# Patient Record
Sex: Female | Born: 1990
Health system: Southern US, Community
[De-identification: ages and names within clinical notes are randomized; demographics above are authoritative.]

## PROBLEM LIST (undated history)

## (undated) DIAGNOSIS — K219 Gastro-esophageal reflux disease without esophagitis: Secondary | ICD-10-CM

## (undated) DIAGNOSIS — G43909 Migraine, unspecified, not intractable, without status migrainosus: Secondary | ICD-10-CM

## (undated) DIAGNOSIS — F419 Anxiety disorder, unspecified: Secondary | ICD-10-CM

## (undated) HISTORY — DX: Migraine, unspecified, not intractable, without status migrainosus: G43.909

## (undated) HISTORY — DX: Gastro-esophageal reflux disease without esophagitis: K21.9

## (undated) HISTORY — DX: Anxiety disorder, unspecified: F41.9

## (undated) HISTORY — PX: EYE SURGERY: SHX253

---

## 2011-08-26 DIAGNOSIS — F419 Anxiety disorder, unspecified: Secondary | ICD-10-CM | POA: Insufficient documentation

## 2013-05-28 ENCOUNTER — Telehealth: Payer: Self-pay | Admitting: Gynecology

## 2013-05-28 ENCOUNTER — Encounter: Payer: Self-pay | Admitting: Gynecology

## 2013-05-28 ENCOUNTER — Ambulatory Visit (INDEPENDENT_AMBULATORY_CARE_PROVIDER_SITE_OTHER): Payer: BC Managed Care – PPO | Admitting: Gynecology

## 2013-05-28 VITALS — BP 110/68 | Ht 61.5 in | Wt 106.0 lb

## 2013-05-28 DIAGNOSIS — N920 Excessive and frequent menstruation with regular cycle: Secondary | ICD-10-CM

## 2013-05-28 DIAGNOSIS — Z30430 Encounter for insertion of intrauterine contraceptive device: Secondary | ICD-10-CM

## 2013-05-28 DIAGNOSIS — Z309 Encounter for contraceptive management, unspecified: Secondary | ICD-10-CM

## 2013-05-28 MED ORDER — MEGESTROL ACETATE 40 MG PO TABS: 40.0000 mg | ORAL_TABLET | Freq: Two times a day (BID) | ORAL | Status: DC

## 2013-05-28 NOTE — Patient Instructions (Addendum)
Intrauterine Device Information  An intrauterine device (IUD) is inserted into your uterus and prevents pregnancy. There are 2 types of IUDs available:  · Copper IUD. This type of IUD is wrapped in copper wire and is placed inside the uterus. Copper makes the uterus and fallopian tubes produce a fluid that kills sperm. The copper IUD can stay in place for 10 years.  · Hormone IUD. This type of IUD contains the hormone progestin (synthetic progesterone). The hormone thickens the cervical mucus and prevents sperm from entering the uterus, and it also thins the uterine lining to prevent implantation of a fertilized egg. The hormone can weaken or kill the sperm that get into the uterus. The hormone IUD can stay in place for 5 years.  Your caregiver will make sure you are a good candidate for a contraceptive IUD. Discuss with your caregiver the possible side effects.  ADVANTAGES  · It is highly effective, reversible, long-acting, and low maintenance.  · There are no estrogen-related side effects.  · An IUD can be used when breastfeeding.  · It is not associated with weight gain.  · It works immediately after insertion.  · The copper IUD does not interfere with your female hormones.  · The progesterone IUD can make heavy menstrual periods lighter.  · The progesterone IUD can be used for 5 years.  · The copper IUD can be used for 10 years.  DISADVANTAGES  · The progesterone IUD can be associated with irregular bleeding patterns.  · The copper IUD can make your menstrual flow heavier and more painful.  · You may experience cramping and vaginal bleeding after insertion.  Document Released: 07/13/2004 Document Revised: 11/01/2011 Document Reviewed: 12/12/2010  ExitCare® Patient Information ©2014 [employer].

## 2013-05-28 NOTE — Progress Notes (Signed)
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 She had gone to the urgent care and I had a negative pregnancy test 3 days ago. She took out the NuvaRing 2 days ago because of her persistent bleeding. This is cause her to have nausea. She stated they did a CBC which was normal as well. She stated that her early this year she had a normal gynecological exam with Pap smear.  We discussed alternative forms of contraception. She does states that when she is not using anything for contraception for her cycles are very irregular. We discussed the different types of contraceptive options and I feel that the Mirena IUD would serve 2 purposes mostly for contraception but as well as for cycle control.  She was counseled for the Mirena IUD as follows:                                 IUD procedure note        The patient had been provided with literature information on this method of contraception. The risks benefits and pros and cons were discussed and all her questions were answered. She is fully aware that this form of contraception is 99% effective and is good for 5 years.  Pelvic exam: Bartholin urethra Skene glands: Within normal limits Vagina: No lesions or discharge,menstrual blood presence Cervix: No lesions or discharge Uterus: retroverted position Adnexa: No masses or tenderness Rectal exam: Not done  The cervix was cleansed with Betadine solution. A single-tooth tenaculum was placed on the anterior cervical lip. The uterus sounded to 7 centimeter. The IUD was shown to the patient and inserted in a sterile fashion. The IUD string was trimmed. The single-tooth tenaculum was removed. Patient was instructed to return back to the office in one month for follow up.      She will be prescribed Megace 40 mg twice a day for 5 days as well to stop the  bleeding.

## 2013-05-28 NOTE — Telephone Encounter (Signed)
05/28/13-Pt was advised that her ins covers the Mirena and insertion at 100%,preventative svcs. JF inserted it today/[facility]

## 2013-05-29 ENCOUNTER — Encounter: Payer: Self-pay | Admitting: Gynecology

## 2013-05-29 ENCOUNTER — Telehealth: Payer: Self-pay | Admitting: Obstetrics & Gynecology

## 2013-05-29 MED ORDER — TRAMADOL HCL 50 MG PO TABS: ORAL_TABLET | ORAL | Status: DC

## 2013-05-29 NOTE — Telephone Encounter (Signed)
Pt had Mirena IUD placed Mon, Oct 6th.  Has been taking Motrin, 400mg , twice today without much improvement in cramping.  She is anxious about this and wants to know if normal and what she can do for pain.  Can feel the iud string.  Feels the same to her as yesterday.  No nausea.  No fever.  Advised to take 800mg  ibuprofen every 6-8 hours around the clock.  Called in Tramadol 50 mg 1-2 every six hours for pain.  Advised pt to call office for follow up if till cramping the same tomorrow.  Pt appreciative and voiced clear understanding.

## 2013-06-19 ENCOUNTER — Ambulatory Visit (INDEPENDENT_AMBULATORY_CARE_PROVIDER_SITE_OTHER): Payer: BC Managed Care – PPO | Admitting: Women's Health

## 2013-06-19 ENCOUNTER — Encounter: Payer: Self-pay | Admitting: Women's Health

## 2013-06-19 DIAGNOSIS — R109 Unspecified abdominal pain: Secondary | ICD-10-CM

## 2013-06-19 DIAGNOSIS — Z975 Presence of (intrauterine) contraceptive device: Secondary | ICD-10-CM | POA: Insufficient documentation

## 2013-06-19 LAB — WET PREP FOR TRICH, YEAST, CLUE: Yeast Wet Prep HPF POC: NONE SEEN

## 2013-06-19 NOTE — Progress Notes (Signed)
Patient ID: Kayla Hampton, female   DOB: 04/13/1991, 22 y.o.   MRN: [id number] Presents with complaint of pelvic/menstrual cramping for past 3 weeks. Minimal relief with ultram and Motrin. 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 Mirena IUD placed 05/28/2013 by Dr. Lily Peer and has had cramping since with minimal spotting. Denies fever, GI symptoms or urinary symptoms. Same partner/years. Gardasil series completed.  Exam: Appears well. External genitalia within normal limits, speculum exam IUD strings visible, scant brown discharge with no odor or erythema noted. Wet prep negative. Bimanual no CMT or adnexal fullness or tenderness, minimal tenderness with exam.  Cramping with new IUD  Plan: Reviewed normality of wet prep, ultrasound to check proper placement. Continue Motrin as needed for discomfort. Note written for missed clinical experience. Reviewed may also be time of menstrual cycle.

## 2013-06-22 ENCOUNTER — Ambulatory Visit: Payer: BC Managed Care – PPO | Admitting: Women's Health

## 2013-06-22 ENCOUNTER — Other Ambulatory Visit: Payer: BC Managed Care – PPO

## 2013-06-27 ENCOUNTER — Ambulatory Visit (INDEPENDENT_AMBULATORY_CARE_PROVIDER_SITE_OTHER): Payer: BC Managed Care – PPO | Admitting: Women's Health

## 2013-06-27 ENCOUNTER — Ambulatory Visit (INDEPENDENT_AMBULATORY_CARE_PROVIDER_SITE_OTHER): Payer: BC Managed Care – PPO

## 2013-06-27 ENCOUNTER — Encounter: Payer: Self-pay | Admitting: Women's Health

## 2013-06-27 ENCOUNTER — Other Ambulatory Visit: Payer: Self-pay | Admitting: Women's Health

## 2013-06-27 DIAGNOSIS — N8300 Follicular cyst of ovary, unspecified side: Secondary | ICD-10-CM

## 2013-06-27 DIAGNOSIS — N854 Malposition of uterus: Secondary | ICD-10-CM

## 2013-06-27 DIAGNOSIS — R109 Unspecified abdominal pain: Secondary | ICD-10-CM

## 2013-06-27 DIAGNOSIS — R102 Pelvic and perineal pain: Secondary | ICD-10-CM

## 2013-06-27 DIAGNOSIS — N949 Unspecified condition associated with female genital organs and menstrual cycle: Secondary | ICD-10-CM

## 2013-06-27 NOTE — Progress Notes (Signed)
Patient ID: Kayla Hampton, female   DOB: 1990/11/26, 22 y.o.   MRN: [id number] Presents for ultrasound. Mirena IUD placed 05/28/2013 and has had cramping severe at times since. States bleeding/spotting has stopped. Cramping is less.  Ultrasound: Retroverted uterus IUD seen in normal position. Tri layered endometrial cavity. Right and left ovaries normal. Negative cul-de-sac.  Mirena IUD with cramping  Plan: Reviewed normality of ultrasound, Motrin as needed for discomfort, reviewed cramping should continue to decrease. Instructed to call if further problems.

## 2013-06-28 ENCOUNTER — Ambulatory Visit: Payer: BC Managed Care – PPO | Admitting: Gynecology

## 2015-02-04 DIAGNOSIS — B001 Herpesviral vesicular dermatitis: Secondary | ICD-10-CM | POA: Insufficient documentation

## 2016-04-13 DIAGNOSIS — F411 Generalized anxiety disorder: Secondary | ICD-10-CM | POA: Insufficient documentation

## 2016-06-25 DIAGNOSIS — G4726 Circadian rhythm sleep disorder, shift work type: Secondary | ICD-10-CM | POA: Insufficient documentation

## 2017-08-19 DIAGNOSIS — G43009 Migraine without aura, not intractable, without status migrainosus: Secondary | ICD-10-CM | POA: Insufficient documentation

## 2020-09-25 DIAGNOSIS — K219 Gastro-esophageal reflux disease without esophagitis: Secondary | ICD-10-CM | POA: Insufficient documentation

## 2021-05-28 ENCOUNTER — Other Ambulatory Visit (HOSPITAL_COMMUNITY): Payer: Self-pay

## 2021-05-28 MED ORDER — VORTIOXETINE HBR 10 MG PO TABS
10.0000 mg | ORAL_TABLET | Freq: Every day | ORAL | 2 refills | Status: DC
  Filled 2021-05-28: qty 90, 90d supply, fill #0
  Filled 2021-08-24: qty 90, 90d supply, fill #1

## 2021-05-28 MED ORDER — DEXLANSOPRAZOLE 60 MG PO CPDR
1.0000 | DELAYED_RELEASE_CAPSULE | Freq: Every day | ORAL | 3 refills | Status: DC
  Filled 2021-05-28 – 2021-06-05 (×2): qty 90, 90d supply, fill #0

## 2021-05-28 MED ORDER — PANTOPRAZOLE SODIUM 40 MG PO TBEC
40.0000 mg | DELAYED_RELEASE_TABLET | Freq: Every day | ORAL | 3 refills | Status: DC
  Filled 2021-05-28 (×3): qty 90, 90d supply, fill #0
  Filled 2021-08-24: qty 90, 90d supply, fill #1
  Filled 2021-12-01: qty 90, 90d supply, fill #2
  Filled 2022-03-15: qty 90, 90d supply, fill #3

## 2021-05-29 ENCOUNTER — Other Ambulatory Visit (HOSPITAL_COMMUNITY): Payer: Self-pay

## 2021-06-05 ENCOUNTER — Other Ambulatory Visit (HOSPITAL_COMMUNITY): Payer: Self-pay

## 2021-08-19 ENCOUNTER — Other Ambulatory Visit (HOSPITAL_COMMUNITY): Payer: Self-pay

## 2021-08-19 ENCOUNTER — Telehealth: Payer: 59 | Admitting: Family

## 2021-08-19 DIAGNOSIS — J019 Acute sinusitis, unspecified: Secondary | ICD-10-CM

## 2021-08-19 MED ORDER — ONDANSETRON HCL 4 MG PO TABS
4.0000 mg | ORAL_TABLET | Freq: Three times a day (TID) | ORAL | 0 refills | Status: DC | PRN
  Filled 2021-08-19: qty 20, 7d supply, fill #0

## 2021-08-19 MED ORDER — AMOXICILLIN-POT CLAVULANATE 875-125 MG PO TABS
1.0000 | ORAL_TABLET | Freq: Two times a day (BID) | ORAL | 0 refills | Status: DC
  Filled 2021-08-19: qty 14, 7d supply, fill #0

## 2021-08-19 NOTE — Progress Notes (Signed)
Virtual Visit Consent   Artavia Hampton, you are scheduled for a virtual visit with a [facility] provider today.     Just as with appointments in the office, your consent must be obtained to participate.  Your consent will be active for this visit and any virtual visit you may have with one of our providers in the next 365 days.     If you have a MyChart account, a copy of this consent can be sent to you electronically.  All virtual visits are billed to your insurance company just like a traditional visit in the office.    As this is a virtual visit, video technology does not allow for your provider to perform a traditional examination.  This may limit your provider's ability to fully assess your condition.  If your provider identifies any concerns that need to be evaluated in person or the need to arrange testing (such as labs, EKG, etc.), we will make arrangements to do so.     Although advances in technology are sophisticated, we cannot ensure that it will always work on either your end or our end.  If the connection with a video visit is poor, the visit may have to be switched to a telephone visit.  With either a video or telephone visit, we are not always able to ensure that we have a secure connection.     I need to obtain your verbal consent now.   Are you willing to proceed with your visit today?    Aylah Yeary has provided verbal consent on 08/19/2021 for a virtual visit (video or telephone).   Jannifer Rodney, FNP   Date: 08/19/2021 8:18 AM   Virtual Visit via Video Note   I, Jannifer Rodney, connected with  Kayla Hampton  ([id number], 05-26-1991) on 08/19/21 at  8:15 AM EST by a video-enabled telemedicine application and verified that I am speaking with the correct person using two identifiers.  Location: Patient: Virtual Visit Location Patient: Other: work Provider: Pharmacist, community: Home Office   I discussed the limitations of evaluation and management by  telemedicine and the availability of in person appointments. The patient expressed understanding and agreed to proceed.    History of Present Illness: Kayla Hampton is a 30 y.o. who identifies as a female who was assigned female at birth, and is being seen today for sinus issues.  HPI: Sinus Problem This is a new problem. The current episode started 1 to 4 weeks ago. The problem has been gradually worsening since onset. There has been no fever. Her pain is at a severity of 2/10. The pain is moderate. Associated symptoms include congestion, coughing, ear pain, headaches, sinus pressure and sneezing. Pertinent negatives include no chills, shortness of breath or sore throat. (Dizziness ) Past treatments include oral decongestants. The treatment provided mild relief.   Problems:  Patient Active Problem List   Diagnosis Date Noted   IUD (intrauterine device) in place 06/19/2013   Menorrhagia 05/28/2013    Allergies: No Known Allergies Medications:  Current Outpatient Medications:    amoxicillin-clavulanate (AUGMENTIN) 875-125 MG tablet, Take 1 tablet by mouth 2 (two) times daily., Disp: 14 tablet, Rfl: 0   ondansetron (ZOFRAN) 4 MG tablet, Take 1 tablet (4 mg total) by mouth every 8 (eight) hours as needed for nausea or vomiting., Disp: 20 tablet, Rfl: 0   pantoprazole (PROTONIX) 40 MG tablet, Take 1 tablet (40 mg total) by mouth daily 30 minutes before meals, Disp: 90 tablet, Rfl:  3   vortioxetine HBr (TRINTELLIX) 10 MG TABS tablet, Take 1 tablet (10 mg total) by mouth daily., Disp: 90 tablet, Rfl: 2  Observations/Objective: Patient is well-developed, well-nourished in no acute distress.  Head is normocephalic, atraumatic.  No labored breathing.  Speech is clear and coherent with logical content.  Patient is alert and oriented at baseline.  Nasal congestion  Assessment and Plan: 1. Acute sinusitis, recurrence not specified, unspecified location - amoxicillin-clavulanate (AUGMENTIN)  875-125 MG tablet; Take 1 tablet by mouth 2 (two) times daily.  Dispense: 14 tablet; Refill: 0  - Take meds as prescribed - Use a cool mist humidifier  -Use saline nose sprays frequently -Force fluids -For any cough or congestion  Use plain Mucinex- regular strength or max strength is fine -For fever or aces or pains- take tylenol or ibuprofen. -Throat lozenges if help  Follow Up Instructions: I discussed the assessment and treatment plan with the patient. The patient was provided an opportunity to ask questions and all were answered. The patient agreed with the plan and demonstrated an understanding of the instructions.  A copy of instructions were sent to the patient via MyChart unless otherwise noted below.     The patient was advised to call back or seek an in-person evaluation if the symptoms worsen or if the condition fails to improve as anticipated.  Time:  I spent 6 minutes with the patient via telehealth technology discussing the above problems/concerns.    Jannifer Rodney, FNP

## 2021-08-24 ENCOUNTER — Other Ambulatory Visit (HOSPITAL_COMMUNITY): Payer: Self-pay

## 2021-09-14 ENCOUNTER — Other Ambulatory Visit (HOSPITAL_COMMUNITY): Payer: Self-pay

## 2021-09-14 DIAGNOSIS — F330 Major depressive disorder, recurrent, mild: Secondary | ICD-10-CM | POA: Diagnosis not present

## 2021-09-14 DIAGNOSIS — F411 Generalized anxiety disorder: Secondary | ICD-10-CM | POA: Diagnosis not present

## 2021-09-14 DIAGNOSIS — F900 Attention-deficit hyperactivity disorder, predominantly inattentive type: Secondary | ICD-10-CM | POA: Diagnosis not present

## 2021-09-14 DIAGNOSIS — F988 Other specified behavioral and emotional disorders with onset usually occurring in childhood and adolescence: Secondary | ICD-10-CM | POA: Insufficient documentation

## 2021-09-14 MED ORDER — ATOMOXETINE HCL 25 MG PO CAPS
25.0000 mg | ORAL_CAPSULE | Freq: Every day | ORAL | 0 refills | Status: DC
  Filled 2021-09-14: qty 14, 14d supply, fill #0

## 2021-09-15 ENCOUNTER — Other Ambulatory Visit (HOSPITAL_COMMUNITY): Payer: Self-pay

## 2021-09-25 ENCOUNTER — Other Ambulatory Visit (HOSPITAL_COMMUNITY): Payer: Self-pay

## 2021-09-25 ENCOUNTER — Encounter: Payer: Self-pay | Admitting: Medical-Surgical

## 2021-09-25 ENCOUNTER — Ambulatory Visit: Payer: 59 | Admitting: Medical-Surgical

## 2021-09-25 ENCOUNTER — Other Ambulatory Visit: Payer: Self-pay

## 2021-09-25 VITALS — BP 107/72 | HR 71 | Resp 20 | Ht 61.0 in | Wt 130.0 lb

## 2021-09-25 DIAGNOSIS — F32A Depression, unspecified: Secondary | ICD-10-CM | POA: Diagnosis not present

## 2021-09-25 DIAGNOSIS — K219 Gastro-esophageal reflux disease without esophagitis: Secondary | ICD-10-CM

## 2021-09-25 DIAGNOSIS — Z7689 Persons encountering health services in other specified circumstances: Secondary | ICD-10-CM | POA: Diagnosis not present

## 2021-09-25 DIAGNOSIS — R41840 Attention and concentration deficit: Secondary | ICD-10-CM | POA: Diagnosis not present

## 2021-09-25 DIAGNOSIS — F419 Anxiety disorder, unspecified: Secondary | ICD-10-CM

## 2021-09-25 DIAGNOSIS — Z0000 Encounter for general adult medical examination without abnormal findings: Secondary | ICD-10-CM

## 2021-09-25 DIAGNOSIS — Z01419 Encounter for gynecological examination (general) (routine) without abnormal findings: Secondary | ICD-10-CM

## 2021-09-25 MED ORDER — VORTIOXETINE HBR 10 MG PO TABS
15.0000 mg | ORAL_TABLET | Freq: Every day | ORAL | 0 refills | Status: DC
  Filled 2021-09-25 – 2021-09-29 (×4): qty 135, 90d supply, fill #0

## 2021-09-25 MED ORDER — ONDANSETRON HCL 4 MG PO TABS
4.0000 mg | ORAL_TABLET | Freq: Three times a day (TID) | ORAL | 0 refills | Status: DC | PRN
  Filled 2021-09-25: qty 20, 7d supply, fill #0

## 2021-09-25 NOTE — Progress Notes (Addendum)
New Patient Office Visit  Subjective:  Patient ID: Kayla Hampton, female    DOB: Nov 20, 1990  Age: 31 y.o. MRN: [id number]  CC:  Chief Complaint  Patient presents with   Establish Care    HPI [name] presents to establish care. Pleasant 31 y.o. female with no complaints at this time. Has been on Vortioxetine for the past year. Increased to 15mg  once daily.Recently started seeing a psychiatrist. States she feels her anxiety has gotten better and denies medication side effects. Also takes Strattera 25mg  for inattention, planning to do ADD/ADHD testing on the 6th with psychiatrist. Denies medication side effects, reports not seeing a huge difference since starting.  Has been seen by gastroenterology for reflux. Reports having reflux in the AM as well as some nausea and vomiting before going to work. She is on Protonix  40mg  daily in the morning and states her reflux is getting better. She denies vomiting episodes and reports the nausea before work has gotten better and is not occurring everyday.Denies chest pain, SOB, abdominal pain, or palpitations.   Would like to be referred to OBGYN to establish care. No concerns at this time.   Past Medical History:  Diagnosis Date   Anxiety    GERD (gastroesophageal reflux disease)    Migraines     Past Surgical History:  Procedure Laterality Date   EYE SURGERY  2021   Lasik    Family History  Problem Relation Age of Onset   Breast cancer Maternal Grandmother    Cancer Maternal Grandmother    Depression Maternal Grandmother    Diabetes Paternal Grandfather    Hypertension Paternal Grandfather    Cancer Paternal Grandfather    Anxiety disorder Mother    Depression Mother    Alcohol abuse Father    Drug abuse Father    ADD / ADHD Sister    Anxiety disorder Sister    Depression Sister    Anxiety disorder Sister    Depression Sister    Cancer Maternal Uncle     Social History   Socioeconomic History   Marital status:  Single    Spouse name: Not on file   Number of children: Not on file   Years of education: Not on file   Highest education level: Not on file  Occupational History   Not on file  Tobacco Use   Smoking status: Former    Packs/day: 0.25    Years: 1.00    Pack years: 0.25    Types: Cigarettes    Quit date: 09/12/2014    Years since quitting: 7.0   Smokeless tobacco: Never  Substance and Sexual Activity   Alcohol use: Yes    Comment: 2-3 drinks a year   Drug use: Never   Sexual activity: Yes    Birth control/protection: I.U.D.    Comment: nuvaring  Other Topics Concern   Not on file  Social History Narrative   Not on file   Social Determinants of Health   Financial Resource Strain: Not on file  Food Insecurity: Not on file  Transportation Needs: Not on file  Physical Activity: Not on file  Stress: Not on file  Social Connections: Not on file  Intimate Partner Violence: Not on file     Objective:   Today's Vitals: BP 107/72 (BP Location: Left Arm, Patient Position: Sitting, Cuff Size: Normal)    Pulse 71    Resp 20    Ht 5\' 1"  (1.549 m)    Wt 59  kg    SpO2 98%    BMI 24.56 kg/m   Physical Exam Constitutional:      Appearance: Normal appearance.  HENT:     Head: Normocephalic and atraumatic.  Cardiovascular:     Rate and Rhythm: Normal rate and regular rhythm.     Heart sounds: Normal heart sounds.  Pulmonary:     Effort: Pulmonary effort is normal.     Breath sounds: Normal breath sounds.  Skin:    General: Skin is warm and dry.  Neurological:     Mental Status: She is alert and oriented to person, place, and time.  Psychiatric:        Mood and Affect: Mood normal.        Behavior: Behavior normal.        Thought Content: Thought content normal.    Assessment & Plan:  1. Encounter to establish care -No concerns at this time  2. Well woman exam Would like referal to OBGYN to establish women's care - Ambulatory referral to Obstetrics / Gynecology   3.  Inattention -No concerns on strattera at this time -Being seen by psychiatry. Appointment on the 6th for ADD/ADHD testing  5. Gastroesophageal reflux disease without esophagitis - Doing well on Protonix, no concerns -Discussed adding Pepcid 20mg  at night in combination with Protonix if AM heartburn worsens. Discussed possibility of weaning off of long-term protonix.  -Will send refill of Zofran PRN AM nausea 6. Anxiety and depression -Doing well on Vortioxetine, no side effects --Discussed counseling, pt will notify if would like referral -Seeing psychiatry in [place], going well. Appt on the 6th.   Outpatient Encounter Medications as of 09/25/2021  Medication Sig   atomoxetine (STRATTERA) 25 MG capsule Take 1 capsule (25 mg total) by mouth daily.   pantoprazole (PROTONIX) 40 MG tablet Take 1 tablet (40 mg total) by mouth daily 30 minutes before meals   [DISCONTINUED] ondansetron (ZOFRAN) 4 MG tablet Take 1 tablet (4 mg total) by mouth every 8 (eight) hours as needed for nausea or vomiting.   [DISCONTINUED] vortioxetine HBr (TRINTELLIX) 10 MG TABS tablet Take 1 tablet (10 mg total) by mouth daily.   ondansetron (ZOFRAN) 4 MG tablet Take 1 tablet (4 mg total) by mouth every 8 (eight) hours as needed for nausea or vomiting.   vortioxetine HBr (TRINTELLIX) 10 MG TABS tablet Take 1.5 tablets (15 mg total) by mouth daily.   [DISCONTINUED] amoxicillin-clavulanate (AUGMENTIN) 875-125 MG tablet Take 1 tablet by mouth 2 (two) times daily.   No facility-administered encounter medications on file as of 09/25/2021.    Follow-up: Return for annual physical exam at your convenience.   11/23/2021, Student NP

## 2021-09-25 NOTE — Progress Notes (Signed)
Medical screening examination/treatment was performed by a student nurse practitioner and as supervising provider I was immediately available for consultation/collaboration. I have reviewed documentation and agree with assessment and plan.  Thayer Ohm, DNP, APRN, FNP-BC [facility]

## 2021-09-28 DIAGNOSIS — F900 Attention-deficit hyperactivity disorder, predominantly inattentive type: Secondary | ICD-10-CM | POA: Diagnosis not present

## 2021-09-28 DIAGNOSIS — F330 Major depressive disorder, recurrent, mild: Secondary | ICD-10-CM | POA: Diagnosis not present

## 2021-09-28 DIAGNOSIS — F411 Generalized anxiety disorder: Secondary | ICD-10-CM | POA: Diagnosis not present

## 2021-09-29 ENCOUNTER — Other Ambulatory Visit (HOSPITAL_COMMUNITY): Payer: Self-pay

## 2021-09-29 ENCOUNTER — Encounter: Payer: Self-pay | Admitting: Medical-Surgical

## 2021-09-29 MED ORDER — ATOMOXETINE HCL 40 MG PO CAPS
40.0000 mg | ORAL_CAPSULE | Freq: Every day | ORAL | 0 refills | Status: DC
  Filled 2021-09-29: qty 30, 30d supply, fill #0

## 2021-09-29 MED ORDER — VORTIOXETINE HBR 20 MG PO TABS
20.0000 mg | ORAL_TABLET | Freq: Every day | ORAL | 1 refills | Status: DC
  Filled 2021-09-29: qty 90, 90d supply, fill #0
  Filled 2022-02-03: qty 90, 90d supply, fill #1

## 2021-10-05 ENCOUNTER — Other Ambulatory Visit (HOSPITAL_COMMUNITY): Payer: Self-pay

## 2021-10-06 ENCOUNTER — Telehealth: Payer: Self-pay

## 2021-10-06 NOTE — Telephone Encounter (Signed)
Submitted by another user Prior authorization BPZ:WCHENIDPOEUM HBr (TRINTELLIX) 20 MG TABS tab Via: Covermymeds Case/Key:[id number] Status: approved  as of 10/05/21 Reason:10/05/2021-10/04/2022 120 tab for 30days Notified Pt via: informed

## 2021-10-21 ENCOUNTER — Emergency Department (HOSPITAL_COMMUNITY)
Admission: EM | Admit: 2021-10-21 | Discharge: 2021-10-21 | Disposition: A | Discharge status: Home / Self Care | Payer: 59 | Attending: Emergency Medicine | Admitting: Emergency Medicine

## 2021-10-21 ENCOUNTER — Other Ambulatory Visit: Payer: Self-pay

## 2021-10-21 ENCOUNTER — Encounter (HOSPITAL_COMMUNITY): Payer: Self-pay | Admitting: Emergency Medicine

## 2021-10-21 ENCOUNTER — Other Ambulatory Visit (HOSPITAL_COMMUNITY): Payer: Self-pay

## 2021-10-21 DIAGNOSIS — R1110 Vomiting, unspecified: Secondary | ICD-10-CM | POA: Insufficient documentation

## 2021-10-21 DIAGNOSIS — E876 Hypokalemia: Secondary | ICD-10-CM | POA: Insufficient documentation

## 2021-10-21 DIAGNOSIS — E860 Dehydration: Secondary | ICD-10-CM | POA: Insufficient documentation

## 2021-10-21 DIAGNOSIS — R112 Nausea with vomiting, unspecified: Secondary | ICD-10-CM

## 2021-10-21 LAB — COMPREHENSIVE METABOLIC PANEL
ALT: 19 U/L (ref 0–44)
AST: 23 U/L (ref 15–41)
Albumin: 5.4 g/dL — ABNORMAL HIGH (ref 3.5–5.0)
Alkaline Phosphatase: 66 U/L (ref 38–126)
Anion gap: 7 (ref 5–15)
BUN: 10 mg/dL (ref 6–20)
CO2: 28 mmol/L (ref 22–32)
Calcium: 9.6 mg/dL (ref 8.9–10.3)
Chloride: 101 mmol/L (ref 98–111)
Creatinine, Ser: 0.71 mg/dL (ref 0.44–1.00)
GFR, Estimated: >60 mL/min (ref >60)
Glucose, Bld: 98 mg/dL (ref 70–99)
Potassium: 3.1 mmol/L — ABNORMAL LOW (ref 3.5–5.1)
Sodium: 136 mmol/L (ref 135–145)
Total Bilirubin: 1 mg/dL (ref 0.3–1.2)
Total Protein: 9.3 g/dL — ABNORMAL HIGH (ref 6.5–8.1)

## 2021-10-21 LAB — CBC WITH DIFFERENTIAL/PLATELET
Abs Immature Granulocytes: 0.01 10*3/uL (ref 0.00–0.07)
Basophils Absolute: 0 10*3/uL (ref 0.0–0.1)
Basophils Relative: 1 %
Eosinophils Absolute: 0 10*3/uL (ref 0.0–0.5)
Eosinophils Relative: 1 %
HCT: 45.8 % (ref 36.0–46.0)
Hemoglobin: 16.1 g/dL — ABNORMAL HIGH (ref 12.0–15.0)
Immature Granulocytes: 0 %
Lymphocytes Relative: 12 %
Lymphs Abs: 0.8 10*3/uL (ref 0.7–4.0)
MCH: 32.3 pg (ref 26.0–34.0)
MCHC: 35.2 g/dL (ref 30.0–36.0)
MCV: 91.8 fL (ref 80.0–100.0)
Monocytes Absolute: 0.4 10*3/uL (ref 0.1–1.0)
Monocytes Relative: 7 %
Neutro Abs: 5.2 10*3/uL (ref 1.7–7.7)
Neutrophils Relative %: 79 %
Platelets: 339 10*3/uL (ref 150–400)
RBC: 4.99 MIL/uL (ref 3.87–5.11)
RDW: 12 % (ref 11.5–15.5)
WBC: 6.6 10*3/uL (ref 4.0–10.5)
nRBC: 0 % (ref 0.0–0.2)

## 2021-10-21 LAB — I-STAT BETA HCG BLOOD, ED (MC, WL, AP ONLY): I-stat hCG, quantitative: <5 m[IU]/mL (ref <5)

## 2021-10-21 LAB — LIPASE, BLOOD: Lipase: 27 U/L (ref 11–51)

## 2021-10-21 MED ORDER — LACTATED RINGERS IV SOLN: INTRAVENOUS | Status: DC

## 2021-10-21 MED ORDER — LACTATED RINGERS IV BOLUS
1000.0000 mL | Freq: Once | INTRAVENOUS | Status: AC
  Administered 2021-10-21: 1000 mL via INTRAVENOUS

## 2021-10-21 MED ORDER — POTASSIUM CHLORIDE CRYS ER 20 MEQ PO TBCR
40.0000 meq | EXTENDED_RELEASE_TABLET | Freq: Once | ORAL | Status: AC
  Administered 2021-10-21: 40 meq via ORAL
  Filled 2021-10-21: qty 2

## 2021-10-21 MED ORDER — POTASSIUM CHLORIDE 10 MEQ/100ML IV SOLN: 10.0000 meq | Freq: Once | INTRAVENOUS | Status: DC

## 2021-10-21 MED ORDER — PANTOPRAZOLE SODIUM 40 MG IV SOLR
40.0000 mg | Freq: Once | INTRAVENOUS | Status: AC
  Administered 2021-10-21: 40 mg via INTRAVENOUS
  Filled 2021-10-21: qty 10

## 2021-10-21 MED ORDER — PANTOPRAZOLE SODIUM 20 MG PO TBEC
20.0000 mg | DELAYED_RELEASE_TABLET | Freq: Two times a day (BID) | ORAL | 0 refills | Status: DC
  Filled 2021-10-21: qty 30, 15d supply, fill #0

## 2021-10-21 MED ORDER — METOCLOPRAMIDE HCL 5 MG/ML IJ SOLN
10.0000 mg | Freq: Once | INTRAMUSCULAR | Status: AC
  Administered 2021-10-21: 10 mg via INTRAVENOUS
  Filled 2021-10-21: qty 2

## 2021-10-21 MED ORDER — ONDANSETRON 8 MG PO TBDP
8.0000 mg | ORAL_TABLET | Freq: Three times a day (TID) | ORAL | 0 refills | Status: DC | PRN
  Filled 2021-10-21: qty 20, 7d supply, fill #0

## 2021-10-21 MED ORDER — POTASSIUM CHLORIDE ER 10 MEQ PO TBCR
10.0000 meq | EXTENDED_RELEASE_TABLET | Freq: Every day | ORAL | 0 refills | Status: DC
  Filled 2021-10-21: qty 4, 4d supply, fill #0

## 2021-10-21 NOTE — ED Triage Notes (Signed)
Reports emesis since last week, thought it resolved but then it started again this Monday. Was around sick contacts with similar symptoms last week, but their symptoms have since resolved. Reports not being able to keep down any food or fluids since Monday.  ?

## 2021-10-21 NOTE — ED Provider Notes (Signed)
?[facility]-EMERGENCY DEPT ?Provider Note ? ? ?CSN: [id number] ?Arrival date & time: 10/21/21  0854 ? ?  ? ?History ? ?Chief Complaint  ?Patient presents with  ? Emesis  ? ? ?Kayla Hampton is a 31 y.o. female. ? ?31 year old female presents with several days of emesis.  Emesis initially was described as being bilious which is since improved.  No fever or chills.  No diarrhea.  No associate abdominal pain.  Does have a history of GI issues in the past and has been seeing by a gastroenterologist and had a EGD without etiology of this.  States she feels dehydrated.  And keep small sips of water now.  Denies any urinary dysuria but does appreciate some decreased urination.  No vaginal bleeding or discharge.  Has an IUD and is unsure of her last menstrual period ? ? ?  ? ?Home Medications ?Prior to Admission medications   ?Medication Sig Start Date End Date Taking? Authorizing Provider  ?atomoxetine (STRATTERA) 25 MG capsule Take 1 capsule (25 mg total) by mouth daily. 09/14/21     ?atomoxetine (STRATTERA) 40 MG capsule Take 1 capsule (40 mg total) by mouth daily. 09/28/21     ?ondansetron (ZOFRAN) 4 MG tablet Take 1 tablet (4 mg total) by mouth every 8 (eight) hours as needed for nausea or vomiting. 09/25/21   Christen Butter, NP  ?pantoprazole (PROTONIX) 40 MG tablet Take 1 tablet (40 mg total) by mouth daily 30 minutes before meals 05/28/21     ?vortioxetine HBr (TRINTELLIX) 20 MG TABS tablet Take 1 tablet (20 mg total) by mouth daily. 09/29/21   Christen Butter, NP  ?   ? ?Allergies    ?Patient has no known allergies.   ? ?Review of Systems   ?Review of Systems  ?All other systems reviewed and are negative. ? ?Physical Exam ?Updated Vital Signs ?BP (!) 142/81 (BP Location: Right Arm)   Pulse 83   Temp (!) 97.5 ?F (36.4 ?C) (Oral)   Resp 16   Ht 1.549 m (5\' 1" )   Wt 59 kg   SpO2 99%   BMI 24.58 kg/m?  ?Physical Exam ?Vitals and nursing note reviewed.  ?Constitutional:   ?   General: She is not in acute  distress. ?   Appearance: Normal appearance. She is well-developed. She is not toxic-appearing.  ?HENT:  ?   Head: Normocephalic and atraumatic.  ?Eyes:  ?   General: Lids are normal.  ?   Conjunctiva/sclera: Conjunctivae normal.  ?   Pupils: Pupils are equal, round, and reactive to light.  ?Neck:  ?   Thyroid: No thyroid mass.  ?   Trachea: No tracheal deviation.  ?Cardiovascular:  ?   Rate and Rhythm: Normal rate and regular rhythm.  ?   Heart sounds: Normal heart sounds. No murmur heard. ?  No gallop.  ?Pulmonary:  ?   Effort: Pulmonary effort is normal. No respiratory distress.  ?   Breath sounds: Normal breath sounds. No stridor. No decreased breath sounds, wheezing, rhonchi or rales.  ?Abdominal:  ?   General: There is no distension.  ?   Palpations: Abdomen is soft.  ?   Tenderness: There is no abdominal tenderness. There is no rebound.  ?Musculoskeletal:     ?   General: No tenderness. Normal range of motion.  ?   Cervical back: Normal range of motion and neck supple.  ?Skin: ?   General: Skin is warm and dry.  ?   Findings:  No abrasion or rash.  ?Neurological:  ?   Mental Status: She is alert and oriented to person, place, and time. Mental status is at baseline.  ?   GCS: GCS eye subscore is 4. GCS verbal subscore is 5. GCS motor subscore is 6.  ?   Cranial Nerves: No cranial nerve deficit.  ?   Sensory: No sensory deficit.  ?   Motor: Motor function is intact.  ?Psychiatric:     ?   Attention and Perception: Attention normal.     ?   Speech: Speech normal.     ?   Behavior: Behavior normal.  ? ? ?ED Results / Procedures / Treatments   ?Labs ?(all labs ordered are listed, but only abnormal results are displayed) ?Labs Reviewed  ?CBC WITH DIFFERENTIAL/PLATELET  ?COMPREHENSIVE METABOLIC PANEL  ?LIPASE, BLOOD  ?I-STAT BETA HCG BLOOD, ED ([facility], [facility], [facility] ONLY)  ? ? ?EKG ?None ? ?Radiology ?No results found. ? ?Procedures ?Procedures  ? ? ?Medications Ordered in ED ?Medications  ?lactated ringers bolus 1,000 mL (has  no administration in time range)  ?lactated ringers infusion (has no administration in time range)  ?pantoprazole (PROTONIX) injection 40 mg (has no administration in time range)  ?metoCLOPramide (REGLAN) injection 10 mg (has no administration in time range)  ? ? ?ED Course/ Medical Decision Making/ A&P ?  ?                        ?Medical Decision Making ?Amount and/or Complexity of Data Reviewed ?Labs: ordered. ? ?Risk ?Prescription drug management. ? ? ?Patient given IV fluids here for suspected dehydration.  Also given antiemetics as well to.  Concern for possible GI etiology of this such as peptic ulcer disease.  Given Protonix IV piggyback and feels much better.  Does have low potassium with K of 3.1.  Patient given oral potassium supplementation at this time.  Patient also given IV Reglan and notes that she now has an appetite.  Plan will be to place patient on several days of potassium supplementation as well as placed on Protonix and patient to follow-up with her GI doctor ? ? ? ? ? ? ? ?Final Clinical Impression(s) / ED Diagnoses ?Final diagnoses:  ?None  ? ? ?Rx / DC Orders ?ED Discharge Orders   ? ? None  ? ?  ? ? ?  ?Lorre Nick, MD ?10/21/21 1044 ? ?

## 2021-10-22 ENCOUNTER — Encounter: Payer: Self-pay | Admitting: Medical-Surgical

## 2021-10-22 DIAGNOSIS — R112 Nausea with vomiting, unspecified: Secondary | ICD-10-CM

## 2021-10-26 DIAGNOSIS — F411 Generalized anxiety disorder: Secondary | ICD-10-CM | POA: Diagnosis not present

## 2021-10-26 DIAGNOSIS — F330 Major depressive disorder, recurrent, mild: Secondary | ICD-10-CM | POA: Diagnosis not present

## 2021-10-26 DIAGNOSIS — F900 Attention-deficit hyperactivity disorder, predominantly inattentive type: Secondary | ICD-10-CM | POA: Diagnosis not present

## 2021-10-29 ENCOUNTER — Other Ambulatory Visit (HOSPITAL_COMMUNITY): Payer: Self-pay

## 2021-10-29 MED ORDER — ATOMOXETINE HCL 40 MG PO CAPS
40.0000 mg | ORAL_CAPSULE | Freq: Every day | ORAL | 1 refills | Status: DC
  Filled 2021-10-29: qty 30, 30d supply, fill #0
  Filled 2021-11-29: qty 30, 30d supply, fill #1

## 2021-11-02 ENCOUNTER — Encounter: Payer: Self-pay | Admitting: Medical-Surgical

## 2021-11-02 ENCOUNTER — Telehealth: Payer: Self-pay | Admitting: Gastroenterology

## 2021-11-02 NOTE — Telephone Encounter (Signed)
Good Morning Dr. Loletha Carrow, ? ?(D.O.D 3/2 PM) ? ? ?I have records for review for patient in Epic from [facility]. Patient is wanting to transfer care to [place] because she feels that [facility] did not help her.Patient has a referral in for Nausea and vomiting. Will you please review records and advise on scheduling? ? ?Thank you.  ?

## 2021-11-08 NOTE — Telephone Encounter (Signed)
Request received to transfer GI care from outside practice to [place].  We appreciate the interest in our practice, however at this time due to high demand from patients without established GI providers we cannot accommodate this transfer.  Ability to accommodate future transfer requests may change over time and the patient can contact us again in 6-12 months if still interested in being seen at [place]. ? ? ?- H. Danis ?

## 2021-11-11 NOTE — Telephone Encounter (Signed)
Called patient and left a voicemail to advise. 

## 2021-11-30 ENCOUNTER — Other Ambulatory Visit (HOSPITAL_COMMUNITY): Payer: Self-pay

## 2021-12-01 ENCOUNTER — Other Ambulatory Visit (HOSPITAL_COMMUNITY): Payer: Self-pay

## 2021-12-17 ENCOUNTER — Encounter: Payer: Self-pay | Admitting: Medical-Surgical

## 2021-12-17 DIAGNOSIS — F32A Depression, unspecified: Secondary | ICD-10-CM

## 2021-12-17 DIAGNOSIS — F988 Other specified behavioral and emotional disorders with onset usually occurring in childhood and adolescence: Secondary | ICD-10-CM

## 2021-12-17 NOTE — Telephone Encounter (Signed)
Referral placed, sign if appropriate.  ?

## 2021-12-30 ENCOUNTER — Other Ambulatory Visit (HOSPITAL_COMMUNITY): Payer: Self-pay

## 2021-12-30 ENCOUNTER — Telehealth: Payer: 59 | Admitting: Physician Assistant

## 2021-12-30 DIAGNOSIS — B9689 Other specified bacterial agents as the cause of diseases classified elsewhere: Secondary | ICD-10-CM

## 2021-12-30 DIAGNOSIS — J019 Acute sinusitis, unspecified: Secondary | ICD-10-CM

## 2021-12-30 MED ORDER — AMOXICILLIN-POT CLAVULANATE 875-125 MG PO TABS
1.0000 | ORAL_TABLET | Freq: Two times a day (BID) | ORAL | 0 refills | Status: DC
  Filled 2021-12-30: qty 14, 7d supply, fill #0

## 2021-12-30 MED ORDER — ONDANSETRON 4 MG PO TBDP
4.0000 mg | ORAL_TABLET | Freq: Three times a day (TID) | ORAL | 0 refills | Status: DC | PRN
  Filled 2021-12-30: qty 20, 7d supply, fill #0

## 2021-12-30 NOTE — Progress Notes (Signed)
E-Visit for Sinus Problems ? ?We are sorry that you are not feeling well.  Here is how we plan to help! ? ?Based on what you have shared with me it looks like you have sinusitis.  Sinusitis is inflammation and infection in the sinus cavities of the head.  Based on your presentation I believe you most likely have Acute Bacterial Sinusitis.  This is an infection caused by bacteria and is treated with antibiotics. I have prescribed Augmentin 875mg /125mg  one tablet twice daily with food, for 7 days. I will also prescribe Zofran for nausea and vomiting. You may use an oral decongestant such as Mucinex D or if you have glaucoma or high blood pressure use plain Mucinex. Saline nasal spray help and can safely be used as often as needed for congestion.  If you develop worsening sinus pain, fever or notice severe headache and vision changes, or if symptoms are not better after completion of antibiotic, please schedule an appointment with a health care provider.   ? ?Sinus infections are not as easily transmitted as other respiratory infection, however we still recommend that you avoid close contact with loved ones, especially the very young and elderly.  Remember to wash your hands thoroughly throughout the day as this is the number one way to prevent the spread of infection! ? ?Home Care: ?Only take medications as instructed by your medical team. ?Complete the entire course of an antibiotic. ?Do not take these medications with alcohol. ?A steam or ultrasonic humidifier can help congestion.  You can place a towel over your head and breathe in the steam from hot water coming from a faucet. ?Avoid close contacts especially the very young and the elderly. ?Cover your mouth when you cough or sneeze. ?Always remember to wash your hands. ? ?Get Help Right Away If: ?You develop worsening fever or sinus pain. ?You develop a severe head ache or visual changes. ?Your symptoms persist after you have completed your treatment  plan. ? ?Make sure you ?Understand these instructions. ?Will watch your condition. ?Will get help right away if you are not doing well or get worse. ? ?Thank you for choosing an e-visit. ? ?Your e-visit answers were reviewed by a board certified advanced clinical practitioner to complete your personal care plan. Depending upon the condition, your plan could have included both over the counter or prescription medications. ? ?Please review your pharmacy choice. Make sure the pharmacy is open so you can pick up prescription now. If there is a problem, you may contact your provider through and have the prescription routed to another pharmacy.  Your safety is important to [employer]. If you have drug allergies check your prescription carefully.  ? ?For the next 24 hours you can use MyChart to ask questions about today's visit, request a non-urgent call back, or ask for a work or school excuse. ?You will get an email in the next two days asking about your experience. I hope that your e-visit has been valuable and will speed your recovery. ? ?I provided 5 minutes of non face-to-face time during this encounter for chart review and documentation.  ? ?

## 2022-01-12 ENCOUNTER — Encounter (HOSPITAL_BASED_OUTPATIENT_CLINIC_OR_DEPARTMENT_OTHER): Payer: Self-pay

## 2022-01-12 ENCOUNTER — Other Ambulatory Visit (HOSPITAL_BASED_OUTPATIENT_CLINIC_OR_DEPARTMENT_OTHER): Payer: Self-pay

## 2022-01-12 ENCOUNTER — Emergency Department (HOSPITAL_BASED_OUTPATIENT_CLINIC_OR_DEPARTMENT_OTHER): Payer: 59

## 2022-01-12 ENCOUNTER — Other Ambulatory Visit: Payer: Self-pay

## 2022-01-12 ENCOUNTER — Emergency Department (HOSPITAL_BASED_OUTPATIENT_CLINIC_OR_DEPARTMENT_OTHER)
Admission: EM | Admit: 2022-01-12 | Discharge: 2022-01-12 | Disposition: A | Discharge status: Home / Self Care | Payer: 59 | Attending: Emergency Medicine | Admitting: Emergency Medicine

## 2022-01-12 DIAGNOSIS — R197 Diarrhea, unspecified: Secondary | ICD-10-CM | POA: Insufficient documentation

## 2022-01-12 DIAGNOSIS — E876 Hypokalemia: Secondary | ICD-10-CM | POA: Insufficient documentation

## 2022-01-12 DIAGNOSIS — R1084 Generalized abdominal pain: Secondary | ICD-10-CM | POA: Insufficient documentation

## 2022-01-12 DIAGNOSIS — R682 Dry mouth, unspecified: Secondary | ICD-10-CM | POA: Diagnosis not present

## 2022-01-12 DIAGNOSIS — R1032 Left lower quadrant pain: Secondary | ICD-10-CM | POA: Diagnosis not present

## 2022-01-12 DIAGNOSIS — R1110 Vomiting, unspecified: Secondary | ICD-10-CM | POA: Diagnosis not present

## 2022-01-12 DIAGNOSIS — F1210 Cannabis abuse, uncomplicated: Secondary | ICD-10-CM | POA: Insufficient documentation

## 2022-01-12 DIAGNOSIS — R102 Pelvic and perineal pain: Secondary | ICD-10-CM | POA: Diagnosis not present

## 2022-01-12 DIAGNOSIS — R112 Nausea with vomiting, unspecified: Secondary | ICD-10-CM | POA: Diagnosis not present

## 2022-01-12 LAB — COMPREHENSIVE METABOLIC PANEL
ALT: 18 U/L (ref 0–44)
AST: 15 U/L (ref 15–41)
Albumin: 4.5 g/dL (ref 3.5–5.0)
Alkaline Phosphatase: 45 U/L (ref 38–126)
Anion gap: 9 (ref 5–15)
BUN: 7 mg/dL (ref 6–20)
CO2: 25 mmol/L (ref 22–32)
Calcium: 9.3 mg/dL (ref 8.9–10.3)
Chloride: 106 mmol/L (ref 98–111)
Creatinine, Ser: 0.58 mg/dL (ref 0.44–1.00)
GFR, Estimated: >60 mL/min (ref >60)
Glucose, Bld: 92 mg/dL (ref 70–99)
Potassium: 3.3 mmol/L — ABNORMAL LOW (ref 3.5–5.1)
Sodium: 140 mmol/L (ref 135–145)
Total Bilirubin: 0.5 mg/dL (ref 0.3–1.2)
Total Protein: 7.3 g/dL (ref 6.5–8.1)

## 2022-01-12 LAB — CBC WITH DIFFERENTIAL/PLATELET
Abs Immature Granulocytes: 0.01 10*3/uL (ref 0.00–0.07)
Basophils Absolute: 0.1 10*3/uL (ref 0.0–0.1)
Basophils Relative: 1 %
Eosinophils Absolute: 0.1 10*3/uL (ref 0.0–0.5)
Eosinophils Relative: 1 %
HCT: 41.1 % (ref 36.0–46.0)
Hemoglobin: 14.5 g/dL (ref 12.0–15.0)
Immature Granulocytes: 0 %
Lymphocytes Relative: 17 %
Lymphs Abs: 1.2 10*3/uL (ref 0.7–4.0)
MCH: 31.7 pg (ref 26.0–34.0)
MCHC: 35.3 g/dL (ref 30.0–36.0)
MCV: 89.7 fL (ref 80.0–100.0)
Monocytes Absolute: 0.7 10*3/uL (ref 0.1–1.0)
Monocytes Relative: 10 %
Neutro Abs: 4.8 10*3/uL (ref 1.7–7.7)
Neutrophils Relative %: 71 %
Platelets: 355 10*3/uL (ref 150–400)
RBC: 4.58 MIL/uL (ref 3.87–5.11)
RDW: 12.2 % (ref 11.5–15.5)
WBC: 6.9 10*3/uL (ref 4.0–10.5)
nRBC: 0 % (ref 0.0–0.2)

## 2022-01-12 LAB — URINALYSIS, ROUTINE W REFLEX MICROSCOPIC
Bilirubin Urine: NEGATIVE
Glucose, UA: NEGATIVE mg/dL
Hgb urine dipstick: NEGATIVE
Ketones, ur: 15 mg/dL — AB
Leukocytes,Ua: NEGATIVE
Nitrite: NEGATIVE
Protein, ur: NEGATIVE mg/dL
Specific Gravity, Urine: 1.018 (ref 1.005–1.030)
pH: 7 (ref 5.0–8.0)

## 2022-01-12 LAB — RAPID URINE DRUG SCREEN, HOSP PERFORMED
Amphetamines: NOT DETECTED
Barbiturates: NOT DETECTED
Benzodiazepines: NOT DETECTED
Cocaine: NOT DETECTED
Opiates: NOT DETECTED
Tetrahydrocannabinol: POSITIVE — AB

## 2022-01-12 LAB — T4, FREE: Free T4: 1.02 ng/dL (ref 0.61–1.12)

## 2022-01-12 LAB — LIPASE, BLOOD: Lipase: 13 U/L (ref 11–51)

## 2022-01-12 LAB — TSH: TSH: 0.623 u[IU]/mL (ref 0.350–4.500)

## 2022-01-12 LAB — HCG, QUANTITATIVE, PREGNANCY: hCG, Beta Chain, Quant, S: <1 m[IU]/mL (ref <5)

## 2022-01-12 MED ORDER — POTASSIUM CHLORIDE 10 MEQ/100ML IV SOLN
10.0000 meq | Freq: Once | INTRAVENOUS | Status: AC
Start: 2022-01-12 — End: 2022-01-12
  Administered 2022-01-12: 10 meq via INTRAVENOUS
  Filled 2022-01-12: qty 100

## 2022-01-12 MED ORDER — PROMETHAZINE HCL 25 MG RE SUPP
25.0000 mg | Freq: Four times a day (QID) | RECTAL | 0 refills | Status: DC | PRN
  Filled 2022-01-12: qty 12, 3d supply, fill #0

## 2022-01-12 MED ORDER — IOHEXOL 300 MG/ML  SOLN
100.0000 mL | Freq: Once | INTRAMUSCULAR | Status: AC | PRN
  Administered 2022-01-12: 60 mL via INTRAVENOUS

## 2022-01-12 MED ORDER — ONDANSETRON 4 MG PO TBDP
4.0000 mg | ORAL_TABLET | Freq: Three times a day (TID) | ORAL | 1 refills | Status: DC | PRN
  Filled 2022-01-12: qty 30, 10d supply, fill #0
  Filled 2022-03-15: qty 30, 10d supply, fill #1

## 2022-01-12 MED ORDER — ONDANSETRON HCL 4 MG/2ML IJ SOLN
4.0000 mg | Freq: Once | INTRAMUSCULAR | Status: AC
  Administered 2022-01-12: 4 mg via INTRAVENOUS
  Filled 2022-01-12 (×2): qty 2

## 2022-01-12 MED ORDER — KETOROLAC TROMETHAMINE 30 MG/ML IJ SOLN
30.0000 mg | Freq: Once | INTRAMUSCULAR | Status: AC
  Administered 2022-01-12: 30 mg via INTRAVENOUS
  Filled 2022-01-12: qty 1

## 2022-01-12 MED ORDER — SODIUM CHLORIDE 0.9 % IV BOLUS
1000.0000 mL | Freq: Once | INTRAVENOUS | Status: AC
  Administered 2022-01-12: 1000 mL via INTRAVENOUS

## 2022-01-12 MED ORDER — DICYCLOMINE HCL 20 MG PO TABS
20.0000 mg | ORAL_TABLET | Freq: Two times a day (BID) | ORAL | 0 refills | Status: DC
  Filled 2022-01-12: qty 20, 10d supply, fill #0

## 2022-01-12 MED ORDER — POTASSIUM CHLORIDE ER 10 MEQ PO TBCR
10.0000 meq | EXTENDED_RELEASE_TABLET | Freq: Every day | ORAL | 0 refills | Status: DC
  Filled 2022-01-12: qty 5, 5d supply, fill #0

## 2022-01-12 NOTE — ED Notes (Signed)
RN provided AVS using Teachback Method. Patient verbalizes understanding of Discharge Instructions. Opportunity for Questioning and Answers were provided by RN. Patient Discharged from ED ambulatory to Home with Family. ? ?

## 2022-01-12 NOTE — ED Notes (Signed)
Patient transported to Imaging at this Time. 

## 2022-01-12 NOTE — ED Notes (Signed)
PA at the Bedside. ?

## 2022-01-12 NOTE — ED Provider Notes (Signed)
[facility] EMERGENCY DEPT Provider Note   CSN: [id number] Arrival date & time: 01/12/22  1751     History  Chief Complaint  Patient presents with   Nausea   Emesis   Diarrhea    Kayla Hampton is a 31 y.o. female who presents to the ED today with complaint of gradual onset, constant, nausea and NBNB emesis that began 3-4 days ago.  Patient reports earlier today she was at work when she began having a sharp left lower quadrant/pelvic pain that she attributes to her ovary.  Immediately afterwards she began having worsening vomiting prompting ED visit today.  She does mention that for the past year she has had intermittent flareups of similar symptoms including nausea, vomiting, diarrhea as well as a gnawing sensation in her stomach.  She states that she is followed with GI at [facility] and had an EGD done without any acute findings.  She was prescribed Protonix which she takes daily.  She states that she also had a right upper quadrant ultrasound without any acute findings.  She states that she has mostly been dealing with her symptoms at home.  She did have to go to the ED in March of this year due to worsening symptoms and has been found to have hypokalemia at 3.1.  Patient states that she was treated for same however never got to the bottom of the cause of her symptoms.  She does admit that last week she smoked marijuana in an attempt to keep things down as she has been persistently vomiting however denies smoking marijuana previously prior to her symptoms starting a year ago.  She denies any previous abdominal surgeries.  She has IUD in place.  She denies being sexually active.  She does mention having night sweats at home however denies fevers.  The history is provided by the patient and medical records.      Home Medications Prior to Admission medications   Medication Sig Start Date End Date Taking? Authorizing Provider  amoxicillin-clavulanate (AUGMENTIN) 875-125 MG  tablet Take 1 tablet by mouth 2 (two) times daily. 12/30/21  Yes Margaretann Loveless, PA-C  atomoxetine (STRATTERA) 40 MG capsule Take 1 capsule (40 mg total) by mouth daily. 10/26/21  Yes   dicyclomine (BENTYL) 20 MG tablet Take 1 tablet (20 mg total) by mouth 2 (two) times daily. 01/12/22  Yes Eshawn Coor, PA-C  ondansetron (ZOFRAN-ODT) 4 MG disintegrating tablet Take 1 tablet (4 mg total) by mouth every 8 (eight) hours as needed for nausea or vomiting. 01/12/22  Yes Hyman Hopes, Keanen Dohse, PA-C  pantoprazole (PROTONIX) 40 MG tablet Take 1 tablet (40 mg total) by mouth daily 30 minutes before meals 05/28/21  Yes   potassium chloride (KLOR-CON) 10 MEQ tablet Take 1 tablet (10 mEq total) by mouth daily for 5 days. 01/12/22 01/17/22 Yes Paizley Ramella, PA-C  PRESCRIPTION MEDICATION Mirena - IUD   Yes [provider]  promethazine (PHENERGAN) 25 MG suppository Place 1 suppository (25 mg total) rectally every 6 (six) hours as needed for nausea or vomiting. 01/12/22  Yes Raela Bohl, PA-C  vortioxetine HBr (TRINTELLIX) 20 MG TABS tablet Take 1 tablet (20 mg total) by mouth daily. 09/29/21  Yes Christen Butter, NP  atomoxetine (STRATTERA) 25 MG capsule Take 1 capsule (25 mg total) by mouth daily. Patient not taking: Reported on 01/12/2022 09/14/21     pantoprazole (PROTONIX) 20 MG tablet Take 1 tablet (20 mg total) by mouth 2 (two) times daily. Patient not taking: Reported  on 01/12/2022 10/21/21   Lorre Nick, MD      Allergies    Patient has no known allergies.    Review of Systems   Review of Systems  Constitutional:  Negative for chills and fever.  Gastrointestinal:  Positive for abdominal pain, diarrhea, nausea and vomiting.  Genitourinary:  Positive for pelvic pain. Negative for dysuria, flank pain, frequency and vaginal discharge.  All other systems reviewed and are negative.  Physical Exam Updated Vital Signs BP 127/84 (BP Location: Right Arm)   Pulse 80   Temp 98.7 F (37.1 C) (Oral)    Resp 16   Ht  (1.575 m)   Wt 54.4 kg   SpO2 100%   BMI 21.95 kg/m  Physical Exam Vitals and nursing note reviewed.  Constitutional:      Appearance: She is not ill-appearing or diaphoretic.     Comments: Tearful on exam  HENT:     Head: Normocephalic and atraumatic.     Mouth/Throat:     Mouth: Mucous membranes are dry.  Eyes:     Conjunctiva/sclera: Conjunctivae normal.  Cardiovascular:     Rate and Rhythm: Normal rate and regular rhythm.     Pulses: Normal pulses.  Pulmonary:     Effort: Pulmonary effort is normal.     Breath sounds: Normal breath sounds. No wheezing, rhonchi or rales.  Abdominal:     Palpations: Abdomen is soft.     Tenderness: There is abdominal tenderness. There is no right CVA tenderness, left CVA tenderness, guarding or rebound.     Comments: Soft, mild periumbilical/LLQ abd TTP, +BS throughout, no r/g/r, neg murphy's, neg mcburney's, no CVA TTP  Musculoskeletal:     Cervical back: Neck supple.  Skin:    General: Skin is warm and dry.  Neurological:     Mental Status: She is alert.    ED Results / Procedures / Treatments   Labs (all labs ordered are listed, but only abnormal results are displayed) Labs Reviewed  COMPREHENSIVE METABOLIC PANEL - Abnormal; Notable for the following components:      Result Value   Potassium 3.3 (*)    All other components within normal limits  URINALYSIS, ROUTINE W REFLEX MICROSCOPIC - Abnormal; Notable for the following components:   Color, Urine COLORLESS (*)    Ketones, ur 15 (*)    All other components within normal limits  RAPID URINE DRUG SCREEN, HOSP PERFORMED - Abnormal; Notable for the following components:   Tetrahydrocannabinol POSITIVE (*)    All other components within normal limits  CBC WITH DIFFERENTIAL/PLATELET  LIPASE, BLOOD  HCG, QUANTITATIVE, PREGNANCY  TSH  T4, FREE    EKG None  Radiology CT Abdomen Pelvis W Contrast  Result Date: 01/12/2022 CLINICAL DATA:  Left lower quadrant  abdominal pain with fever, nausea, vomiting, and diarrhea for the past 3 days. EXAM: CT ABDOMEN AND PELVIS WITH CONTRAST TECHNIQUE: Multidetector CT imaging of the abdomen and pelvis was performed using the standard protocol following bolus administration of intravenous contrast. RADIATION DOSE REDUCTION: This exam was performed according to the departmental dose-optimization program which includes automated exposure control, adjustment of the mA and/or kV according to patient size and/or use of iterative reconstruction technique. CONTRAST:  60mL OMNIPAQUE IOHEXOL 300 MG/ML  SOLN COMPARISON:  Pelvic ultrasound from same day. Right upper quadrant ultrasound dated January 21, 2021. FINDINGS: Lower chest: No acute abnormality. Hepatobiliary: 8 mm hypodense lesion in the posterior right liver consistent with hemangioma as seen on prior  ultrasounds. No new focal liver abnormality. The gallbladder is unremarkable. No biliary dilatation. Pancreas: Unremarkable. No pancreatic ductal dilatation or surrounding inflammatory changes. Spleen: Normal in size without focal abnormality. Adrenals/Urinary Tract: Adrenal glands are unremarkable. Kidneys are normal, without renal calculi, focal lesion, or hydronephrosis. Bladder is unremarkable. Stomach/Bowel: Stomach is within normal limits. Appendix appears normal. No evidence of bowel wall thickening, distention, or inflammatory changes. Vascular/Lymphatic: No significant vascular findings are present. No enlarged abdominal or pelvic lymph nodes. Reproductive: Retroverted uterus with IUD appropriately positioned within the endometrial canal. No adnexal Mass. Other: Trace free fluid in the pelvis, likely physiologic. No pneumoperitoneum. Musculoskeletal: No acute or significant osseous findings. IMPRESSION: 1. No acute intra-abdominal process. Electronically Signed   By: Obie Dredge M.D.   On: 01/12/2022 12:08   US PELVIC COMPLETE W TRANSVAGINAL AND TORSION R/O  Result Date:  01/12/2022 CLINICAL DATA:  Pelvic ultrasound, left pelvic pain EXAM: TRANSABDOMINAL ULTRASOUND OF PELVIS DOPPLER ULTRASOUND OF OVARIES TECHNIQUE: Transabdominal ultrasound examination of the pelvis was performed including evaluation of the uterus, ovaries, adnexal regions, and pelvic cul-de-sac. Color and duplex Doppler ultrasound was utilized to evaluate blood flow to the ovaries. COMPARISON:  None Available. FINDINGS: Uterus Measurements: 6.2 x 3.3 x 4.1 cm = volume: 43 mL. Retroflexed. No fibroids or other mass visualized. Endometrium Thickness: 5.5 mm. IUD in appropriate position. Fluid is seen in the lower endometrial cavity. No focal abnormality visualized. Right ovary Measurements: 3.0 x 1.7 x 2.6 cm = volume: 7.0 mL. Normal appearance/no adnexal mass. Left ovary Measurements: 2.6 x 2.3 x 1.7 cm = volume: 5.3 mL. Normal appearance/no adnexal mass. Pulsed Doppler evaluation demonstrates normal low-resistance arterial and venous waveforms in both ovaries. Other: Trace free fluid seen in the pelvis. IMPRESSION: 1. No evidence of ovarian torsion. 2.  IUD in appropriate position. Electronically Signed   By: Allegra Lai M.D.   On: 01/12/2022 11:12    Procedures Procedures    Medications Ordered in ED Medications  potassium chloride 10 mEq in 100 mL IVPB (10 mEq Intravenous New Bag/Given 01/12/22 1201)  sodium chloride 0.9 % bolus 1,000 mL (0 mLs Intravenous Stopped 01/12/22 1235)  ondansetron (ZOFRAN) injection 4 mg (4 mg Intravenous Given 01/12/22 1025)  ketorolac (TORADOL) 30 MG/ML injection 30 mg (30 mg Intravenous Given 01/12/22 1028)  iohexol (OMNIPAQUE) 300 MG/ML solution 100 mL (60 mLs Intravenous Contrast Given 01/12/22 1148)    ED Course/ Medical Decision Making/ A&P                           Medical Decision Making 31 year old female who presents to the ED today with complaint of nausea, vomiting, diarrhea for the past 3 to 4 days with associated left lower quadrant/pelvic pain that  began earlier today with worsening symptoms.  Has had intermittent issues with same over the past year with a negative work-up from GI including right upper quadrant ultrasound and EGD.  Compliant with Protonix however not helping symptoms.  Admits to smoking marijuana last week however denies previous smoking to suggest cannabis hyperemesis syndrome.  On arrival to the ED today her vitals are stable.  Patient is tearful on exam and appears frustrated due to not having any answers for her symptoms.  She is a Engineer, civil (consulting).  On exam she is noted to have some mild periumbilical abdominal as well as left lower quadrant abdominal tenderness palpation.  No rebound or guarding.  Given pain today in the left lower quadrant  that patient associates with her ovary we will plan for pelvic ultrasound at this time.  We will plan for labs, fluids, antiemetics, pain medication with reevaluation.  Will likely consider CT scan if no acute findings given persistent symptoms without any previous CT scans in the past.  Does have follow-up appointment with GI this Friday.   CBC without leukocytosis. Hgb stable without signs of anemia.  CMP with potassium 3.3; will replete in the ED today. Remainder of electrolytes within normal limits. LFTs unremarkable.  Lipase 13 Hcg quant negative U/A without signs of infection   Pelvic ultrasound: IMPRESSION:  1. No evidence of ovarian torsion.  2.  IUD in appropriate position.   Do not feel pt requires pelvic exam at this time as she denies vaginal discharge or being sexually active to suggest concern for STIs/PID. Will proceed with CT at this time. Attending physician Dr. Denton LankSteinl evaluated patient as well and mother requesting thyroid levels be checked. TSH and T4 added on at this time.   TSH within normal limits at 0.623. T4 pending at this time.   CT scan: IMPRESSION:  1. No acute intra-abdominal process.   Workup overall reassuring. [facility]  admitted to [facility] last week. She denies smoking in the past to warrant CHS. On repeat eval she reports improvement in symptoms. Will plan to discharge home at this time with symptomatic treatment including zofran ODT, bentyl, and phenergan suppositories. Pt has appointment with GI on Friday. Mom is very concerned regarding pt's gallbladder. Pt has had ultrasound in the past which was negative. No findings on CT scan at this time and LFTs unremarkable. I do not feel repeat ultrasound would be beneficial in the ED setting today however pt may benefit from hida scan in the future. She is stable for discharge home at this time. All questions have been answered prior to discharge.   Problems Addressed: Generalized abdominal pain: acute illness or injury Hypokalemia: acute illness or injury Nausea vomiting and diarrhea: acute illness or injury  Amount and/or Complexity of Data Reviewed Labs: ordered. Decision-making details documented in ED Course. Radiology: ordered. Decision-making details documented in ED Course.  Risk Prescription drug management.           Final Clinical Impression(s) / ED Diagnoses Final diagnoses:  Generalized abdominal pain  Nausea vomiting and diarrhea  Hypokalemia    Rx / DC Orders ED Discharge Orders          Ordered    ondansetron (ZOFRAN-ODT) 4 MG disintegrating tablet  Every 8 hours PRN        01/12/22 1246    dicyclomine (BENTYL) 20 MG tablet  2 times daily        01/12/22 1246    promethazine (PHENERGAN) 25 MG suppository  Every 6 hours PRN        01/12/22 1246    potassium chloride (KLOR-CON) 10 MEQ tablet  Daily        01/12/22 1247             Discharge Instructions      Please pick up medications and take as prescribed/needing. Try to stay as hydrated as you can by drinking plenty of fluids.   Follow up with GI on Friday as scheduled.  Your potassium was slightly low today and we have repleted it in the ED however has  discharged you home with additional potassium supplements. You should have your potassium level rechecked in 1-2 weeks. This  can be done by GI or your PCP.   Return to the ED for any new/worsening symptoms.        Tanda Rockers, PA-C 01/12/22 1249    Cathren Laine, MD 01/12/22 1544

## 2022-01-12 NOTE — ED Triage Notes (Signed)
Pt. States she has been having N/V/D for 3 days. Can't keep anything down. Denies fever. C/O chills, night sweats. Pain at 6/10. States had sharp ovary pain left side and immediately started vomiting. Feels weak and shaky.

## 2022-01-12 NOTE — Discharge Instructions (Addendum)
Please pick up medications and take as prescribed/needing. Try to stay as hydrated as you can by drinking plenty of fluids.   Follow up with GI on Friday as scheduled.  Note that increasingly we are seeing a recurrent nausea/vomiting, and abdominal pain syndrome called Cannabinoid Hyperemesis Syndrome - see attached info - in these cases, avoiding marijuana use will lead to resolution of symptoms, and prevention of recurrence.   Your potassium was slightly low today and we have repleted it in the ED however has discharged you home with additional potassium supplements. You should have your potassium level rechecked in 1-2 weeks. This can be done by GI or your PCP.   Return to the ED for any new/worsening symptoms.

## 2022-01-13 ENCOUNTER — Other Ambulatory Visit (HOSPITAL_COMMUNITY): Payer: Self-pay

## 2022-01-13 ENCOUNTER — Encounter (HOSPITAL_COMMUNITY): Payer: Self-pay | Admitting: Psychiatry

## 2022-01-13 ENCOUNTER — Telehealth (INDEPENDENT_AMBULATORY_CARE_PROVIDER_SITE_OTHER): Payer: 59 | Admitting: Psychiatry

## 2022-01-13 ENCOUNTER — Ambulatory Visit: Payer: 59 | Admitting: Sports Medicine

## 2022-01-13 DIAGNOSIS — F411 Generalized anxiety disorder: Secondary | ICD-10-CM | POA: Diagnosis not present

## 2022-01-13 DIAGNOSIS — F900 Attention-deficit hyperactivity disorder, predominantly inattentive type: Secondary | ICD-10-CM | POA: Diagnosis not present

## 2022-01-13 DIAGNOSIS — F330 Major depressive disorder, recurrent, mild: Secondary | ICD-10-CM

## 2022-01-13 MED ORDER — ATOMOXETINE HCL 25 MG PO CAPS
25.0000 mg | ORAL_CAPSULE | Freq: Every day | ORAL | 0 refills | Status: DC
  Filled 2022-01-13: qty 30, 30d supply, fill #0
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
## 2022-01-15 DIAGNOSIS — F410 Panic disorder [episodic paroxysmal anxiety] without agoraphobia: Secondary | ICD-10-CM | POA: Insufficient documentation

## 2022-01-15 DIAGNOSIS — R112 Nausea with vomiting, unspecified: Secondary | ICD-10-CM | POA: Diagnosis not present

## 2022-01-15 DIAGNOSIS — R1013 Epigastric pain: Secondary | ICD-10-CM | POA: Diagnosis not present

## 2022-01-19 ENCOUNTER — Telehealth (HOSPITAL_COMMUNITY): Payer: Self-pay

## 2022-01-19 NOTE — Telephone Encounter (Signed)
Patient called stating that her Gastroenterologist wants to know if she can take Nortriptyline and what is the maximum dose she can take since she is already on anxiety meds? Please advise  CB [phone]

## 2022-01-19 NOTE — Telephone Encounter (Signed)
Informed patient of everything Dr. Akhtar stated in previous message. She stated her understanding. Nothing further is needed at this time 

## 2022-01-20 ENCOUNTER — Other Ambulatory Visit (HOSPITAL_COMMUNITY): Payer: Self-pay

## 2022-01-20 MED ORDER — NORTRIPTYLINE HCL 10 MG PO CAPS
ORAL_CAPSULE | ORAL | 0 refills | Status: DC
  Filled 2022-01-20: qty 106, 60d supply, fill #0

## 2022-01-21 ENCOUNTER — Telehealth: Payer: 59 | Admitting: Physician Assistant

## 2022-01-21 DIAGNOSIS — J069 Acute upper respiratory infection, unspecified: Secondary | ICD-10-CM

## 2022-01-21 NOTE — Progress Notes (Signed)
Because of ongoing and progressive symptoms despite treatment via e-visit, I feel your condition warrants further evaluation and I recommend that you be seen in a face to face visit. We want to make sure you get a lung examination and make sure you are getting the proper treatments to take care of things.    NOTE: There will be NO CHARGE for this eVisit   If you are having a true medical emergency please call 911.      For an urgent face to face visit, [facility] has six urgent care centers for your convenience:     [facility] Health Urgent Care Center at [facility] Directions [phone] [address] [place], [place] [address]    [facility] Health Urgent Care Center [facility]) Get Driving Directions [phone] [address] [place], [place] [address]  [facility] Health Urgent Care Center [facility]) Get Driving Directions [phone] [address] [place],  [place]  [address]  [facility] Health Urgent Care at [facility] Get Driving Directions [phone] [address], [address] [place], [place] [address]   [facility] Health Urgent Care at [facility] Get Driving Directions  [phone] [address] [place], [place] [address]   [facility] Health Urgent Care at [facility] Directions [phone] [address], [address] [place], [place] [address]  Your MyChart E-visit questionnaire answers were reviewed by a board certified advanced clinical practitioner to complete your personal care plan based on your specific symptoms.  Thank you for using e-Visits.

## 2022-01-28 DIAGNOSIS — R1013 Epigastric pain: Secondary | ICD-10-CM | POA: Diagnosis not present

## 2022-01-28 DIAGNOSIS — R112 Nausea with vomiting, unspecified: Secondary | ICD-10-CM | POA: Diagnosis not present

## 2022-02-03 ENCOUNTER — Other Ambulatory Visit (HOSPITAL_COMMUNITY): Payer: Self-pay

## 2022-02-15 ENCOUNTER — Telehealth (HOSPITAL_COMMUNITY): Payer: 59 | Admitting: Psychiatry

## 2022-02-25 ENCOUNTER — Encounter: Payer: 59 | Admitting: Obstetrics and Gynecology

## 2022-03-15 ENCOUNTER — Other Ambulatory Visit (HOSPITAL_COMMUNITY): Payer: Self-pay

## 2022-03-16 ENCOUNTER — Other Ambulatory Visit (HOSPITAL_COMMUNITY): Payer: Self-pay

## 2022-03-16 MED ORDER — NORTRIPTYLINE HCL 10 MG PO CAPS
ORAL_CAPSULE | ORAL | 0 refills | Status: DC
  Filled 2022-03-16: qty 106, 60d supply, fill #0

## 2022-05-18 ENCOUNTER — Other Ambulatory Visit: Payer: Self-pay | Admitting: Medical-Surgical

## 2022-05-18 ENCOUNTER — Other Ambulatory Visit (HOSPITAL_COMMUNITY): Payer: Self-pay

## 2022-05-18 MED ORDER — NORTRIPTYLINE HCL 10 MG PO CAPS
20.0000 mg | ORAL_CAPSULE | Freq: Every evening | ORAL | 1 refills | Status: DC
Start: 2022-05-18 — End: 2023-04-14
  Filled 2022-05-18: qty 180, 90d supply, fill #0
  Filled 2022-08-31 (×2): qty 180, 90d supply, fill #1

## 2022-05-19 ENCOUNTER — Other Ambulatory Visit (HOSPITAL_COMMUNITY): Payer: Self-pay

## 2022-05-19 MED ORDER — VORTIOXETINE HBR 20 MG PO TABS
20.0000 mg | ORAL_TABLET | Freq: Every day | ORAL | 1 refills | Status: DC
  Filled 2022-05-19: qty 90, 90d supply, fill #0
  Filled 2022-08-31 (×2): qty 90, 90d supply, fill #1

## 2022-07-16 IMAGING — CT CT ABD-PELV W/ CM
2 of 4 series · 16 of 46 positions shown, 18 images · IV contrast (APPLIED)
Comparison: Pelvic ultrasound from same day. Right upper quadrant
ultrasound dated January 21, 2021.

CLINICAL DATA: Left lower quadrant abdominal pain with fever,
nausea, vomiting, and diarrhea for the past 3 days.

EXAM:
CT ABDOMEN AND PELVIS WITH CONTRAST
TECHNIQUE: Multidetector CT imaging of the abdomen and pelvis was performed
using the standard protocol following bolus administration of
intravenous contrast.

[Series 2: abd pel w · axial · 0.67mm/px · z∈[-340,+30]mm · 13 of 82 slices shown, 15 images]
[im 4/82  soft-tissue]
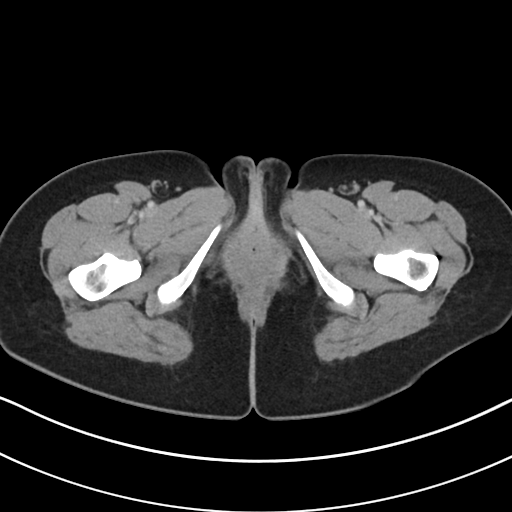
[im 4/82  bone]
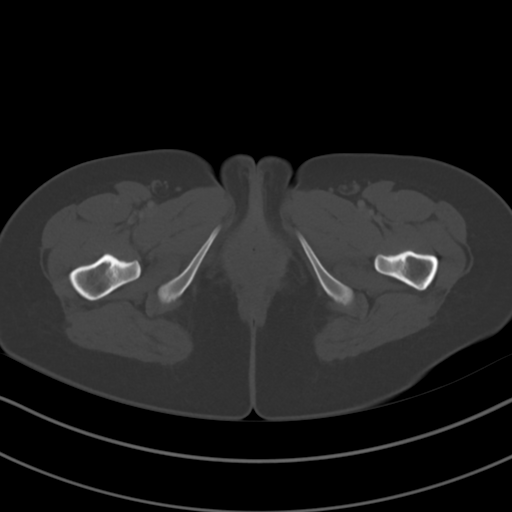
[im 10/82  soft-tissue]
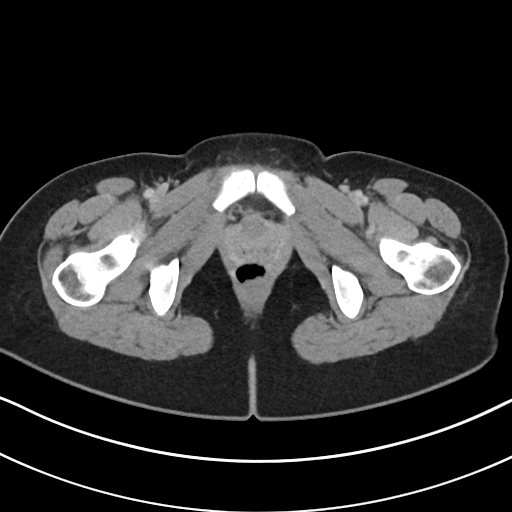
[im 17/82  soft-tissue]
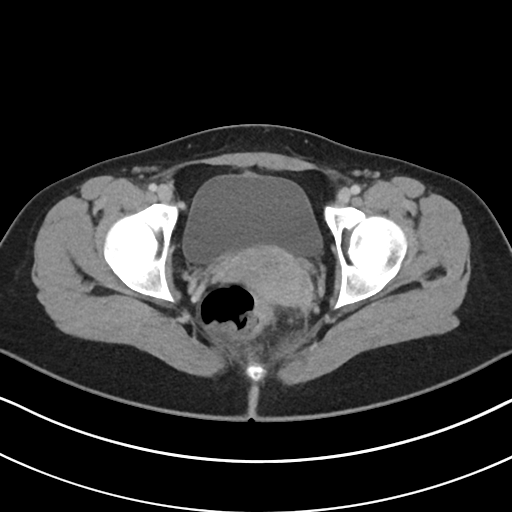
[im 23/82  soft-tissue]
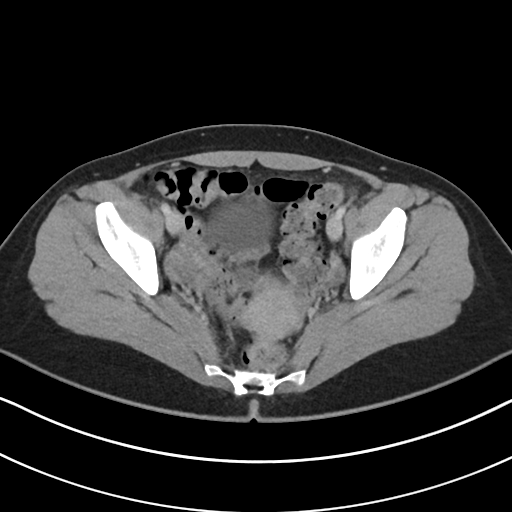
[im 30/82  soft-tissue]
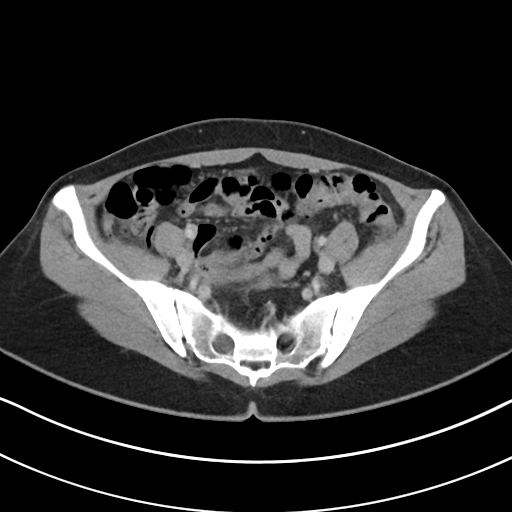
[im 36/82  soft-tissue]
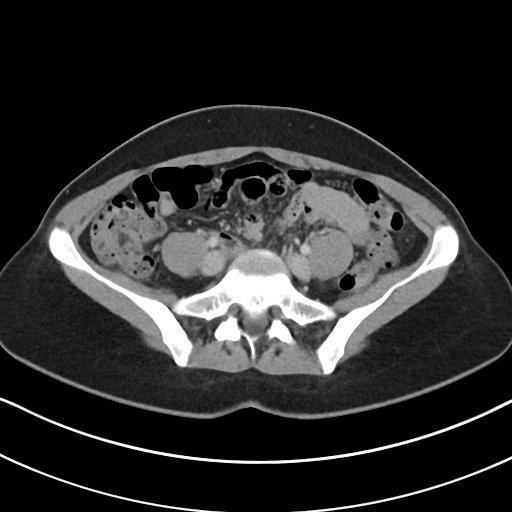
[im 43/82  soft-tissue]
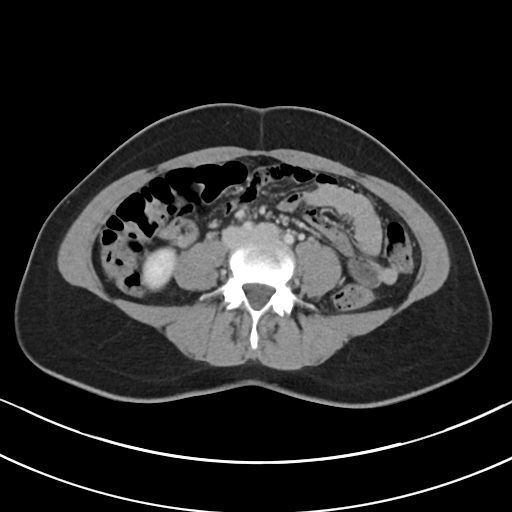
[im 46/82  soft-tissue]
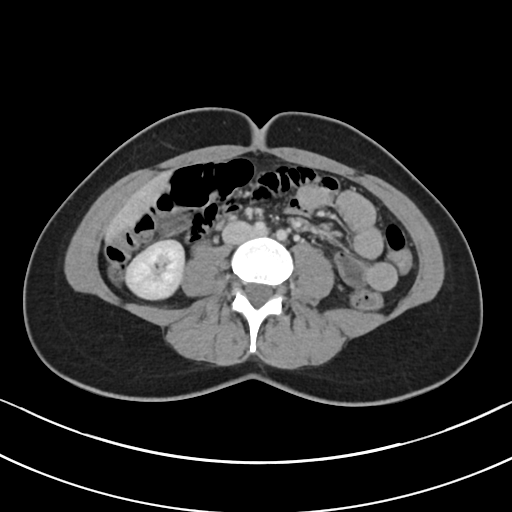
[im 52/82  soft-tissue]
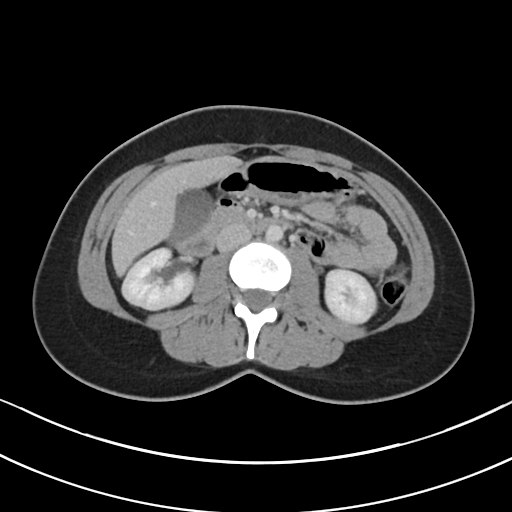
[im 52/82  bone]
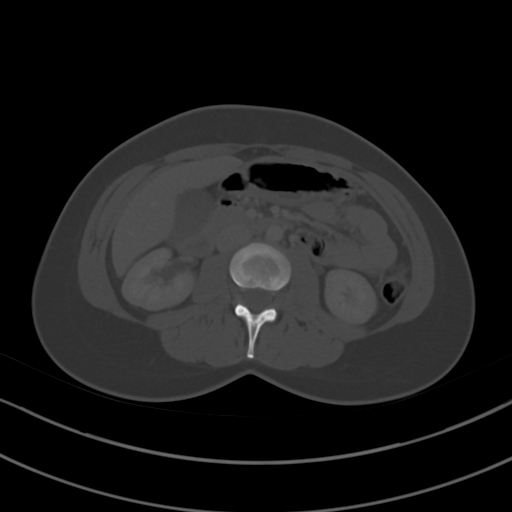
[im 59/82  soft-tissue]
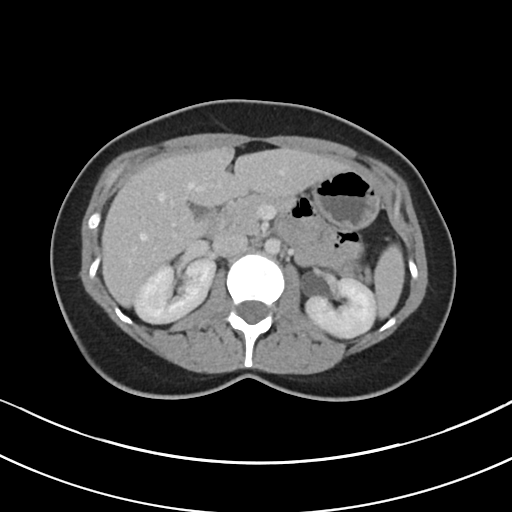
[im 65/82  soft-tissue]
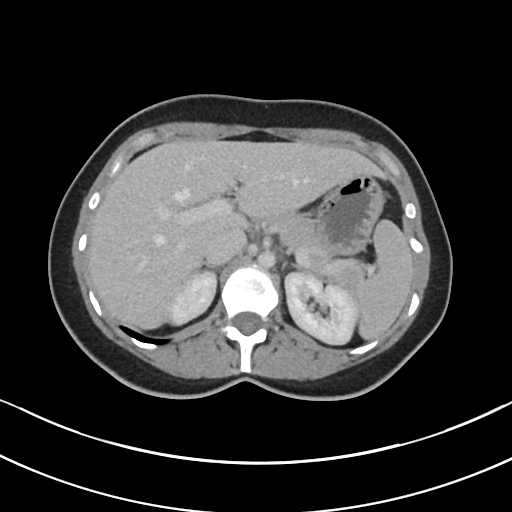
[im 72/82  soft-tissue]
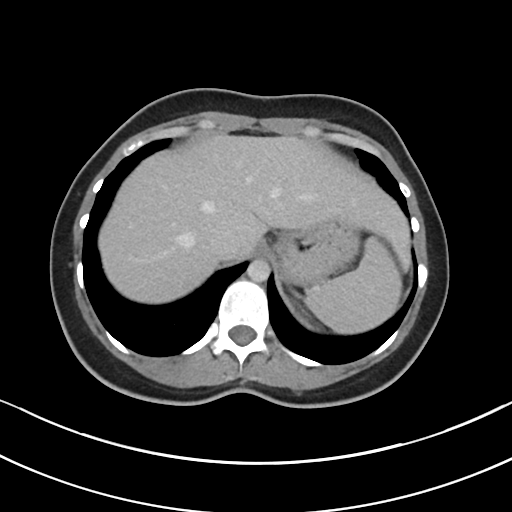
[im 78/82  soft-tissue]
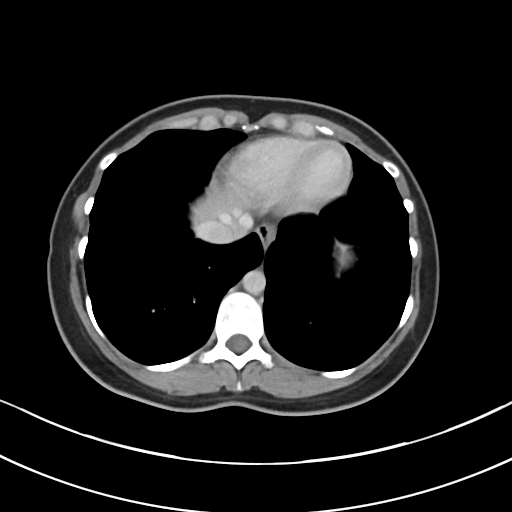

[Series 5: coronal · coronal · 0.71mm/px · 3 of 77 slices shown]
[im 26/77  soft-tissue]
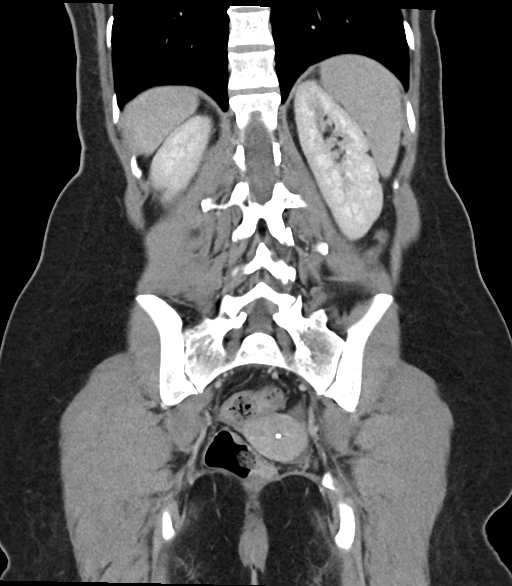
[im 34/77  soft-tissue]
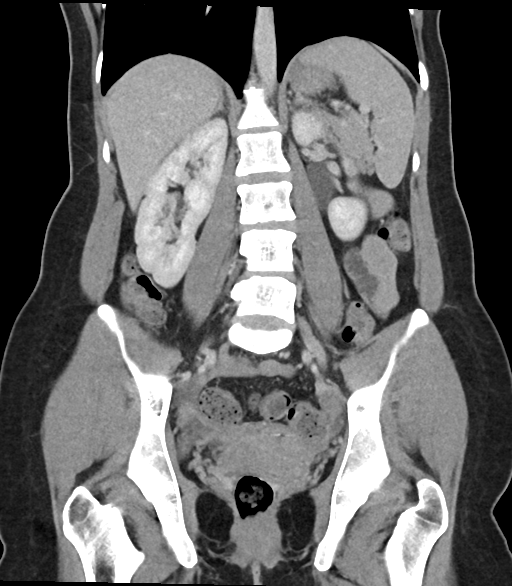
[im 43/77  soft-tissue]
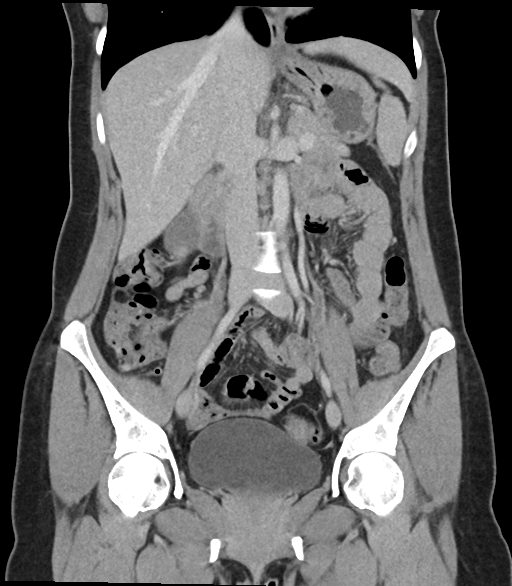

[16 of 46 positions shown; findings below may reference images not displayed]

RADIATION DOSE REDUCTION: This exam was performed according to the
departmental dose-optimization program which includes automated
exposure control, adjustment of the mA and/or kV according to
patient size and/or use of iterative reconstruction technique.

CONTRAST:  60mL OMNIPAQUE IOHEXOL 300 MG/ML  SOLN
FINDINGS: Lower chest: No acute abnormality.

Hepatobiliary: 8 mm hypodense lesion in the posterior right liver
consistent with hemangioma as seen on prior ultrasounds. No new
focal liver abnormality. The gallbladder is unremarkable. No biliary
dilatation.

Pancreas: Unremarkable. No pancreatic ductal dilatation or
surrounding inflammatory changes.

Spleen: Normal in size without focal abnormality.

Adrenals/Urinary Tract: Adrenal glands are unremarkable. Kidneys are
normal, without renal calculi, focal lesion, or hydronephrosis.
Bladder is unremarkable.

Stomach/Bowel: Stomach is within normal limits. Appendix appears
normal. No evidence of bowel wall thickening, distention, or
inflammatory changes.

Vascular/Lymphatic: No significant vascular findings are present. No
enlarged abdominal or pelvic lymph nodes.

Reproductive: Retroverted uterus with IUD appropriately positioned
within the endometrial canal. No adnexal Mass.

Other: Trace free fluid in the pelvis, likely physiologic. No
pneumoperitoneum.

Musculoskeletal: No acute or significant osseous findings.
IMPRESSION: 1. No acute intra-abdominal process.

## 2022-07-16 IMAGING — US US PELVIS COMPLETE TRANSABD/TRANSVAG W DUPLEX AND/OR DOPPLER
1 series · 14 of 25 positions shown · non-contrast
Comparison: None Available.

CLINICAL DATA: Pelvic ultrasound, left pelvic pain

EXAM:
TRANSABDOMINAL ULTRASOUND OF PELVIS
DOPPLER ULTRASOUND OF OVARIES
TECHNIQUE: Transabdominal ultrasound examination of the pelvis was performed
including evaluation of the uterus, ovaries, adnexal regions, and
pelvic cul-de-sac.
Color and duplex Doppler ultrasound was utilized to evaluate blood
flow to the ovaries.

[Series 1: us pelvis (transabdominal only) · 14 of 59 slices shown]
[im 1/59]
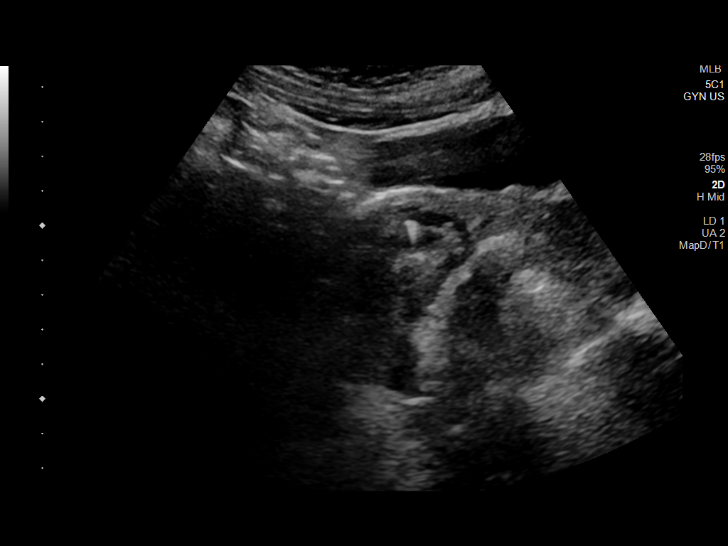
[im 5/59]
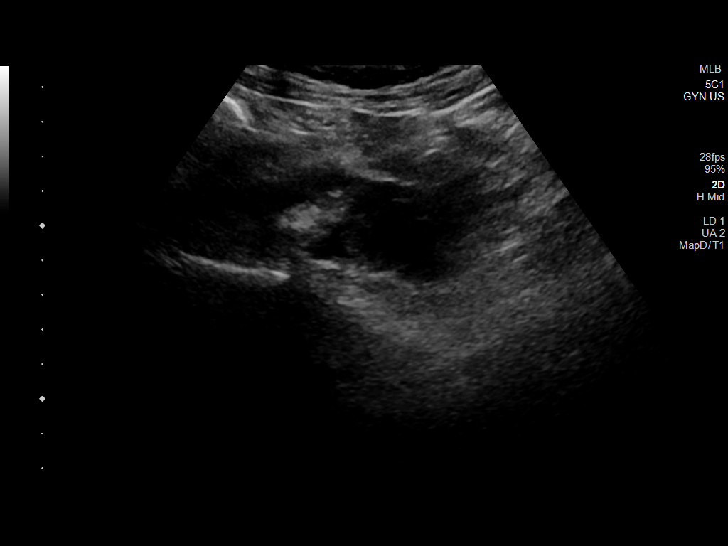
[im 10/59]
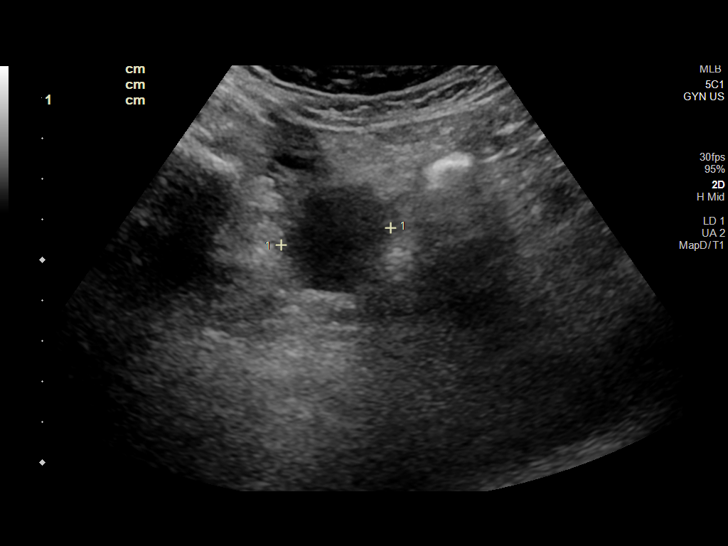
[im 15/59]
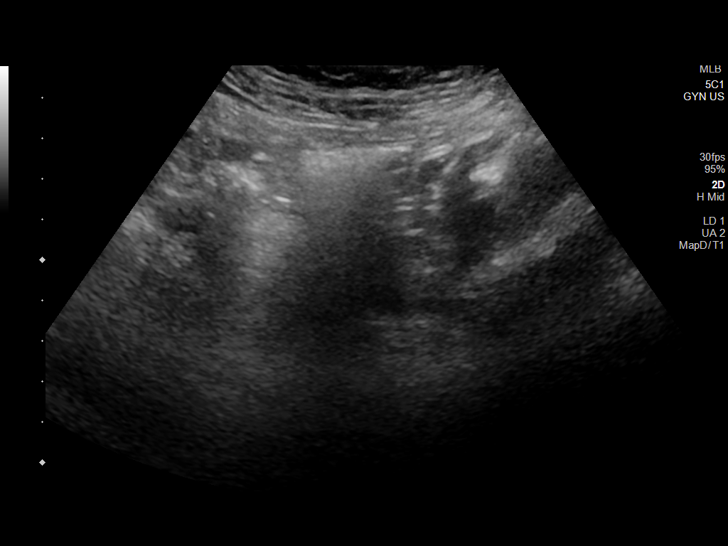
[im 20/59]
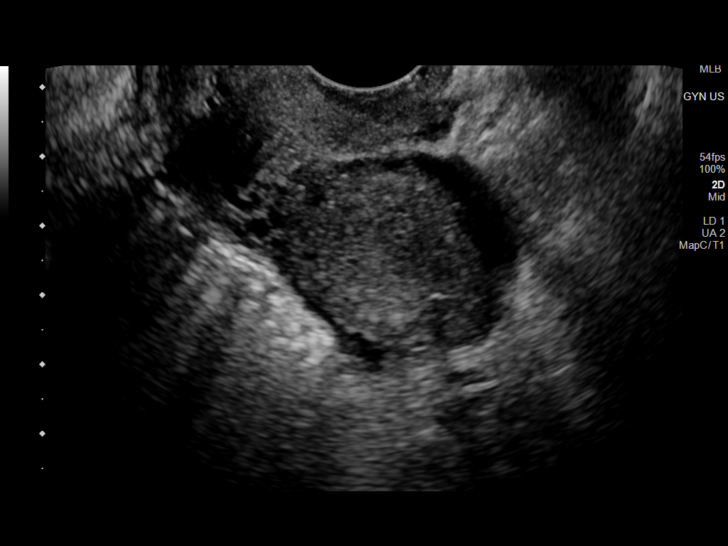
[im 22/59]
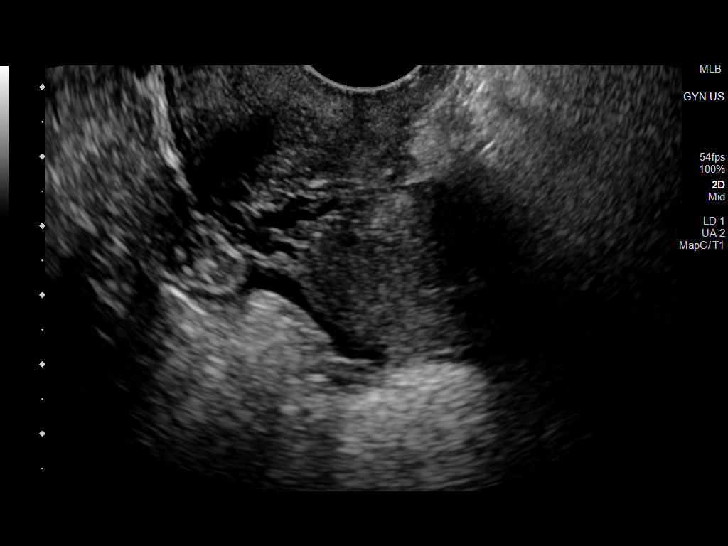
[im 27/59]
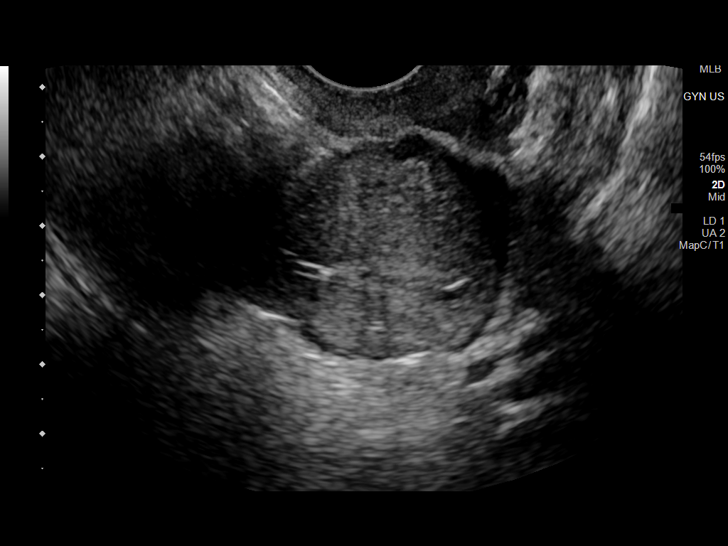
[im 32/59]
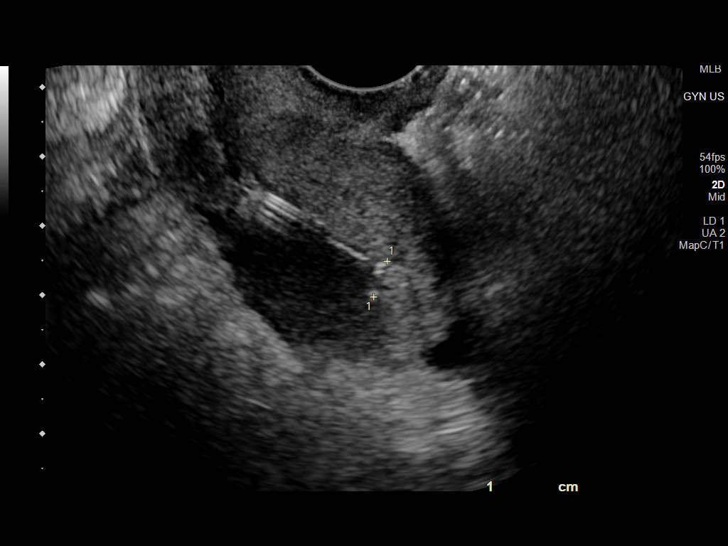
[im 37/59]
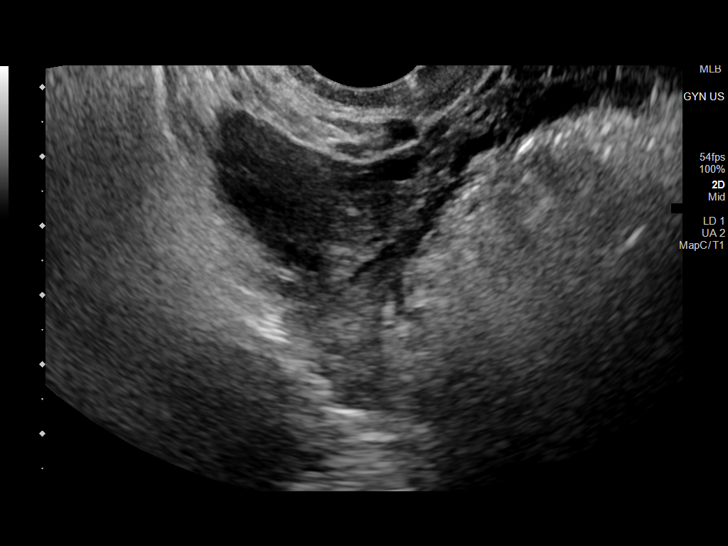
[im 39/59]
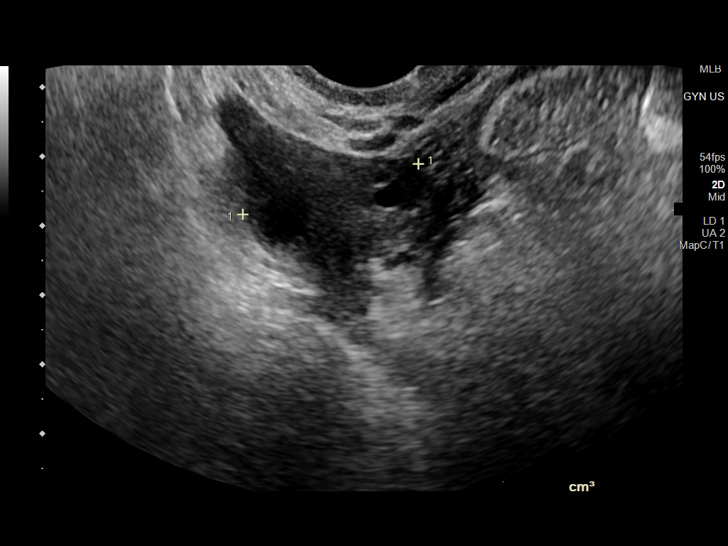
[im 44/59]
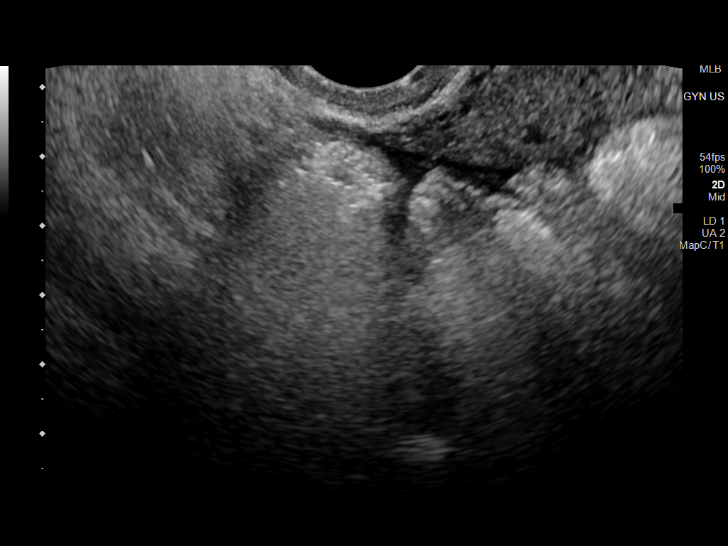
[im 49/59]
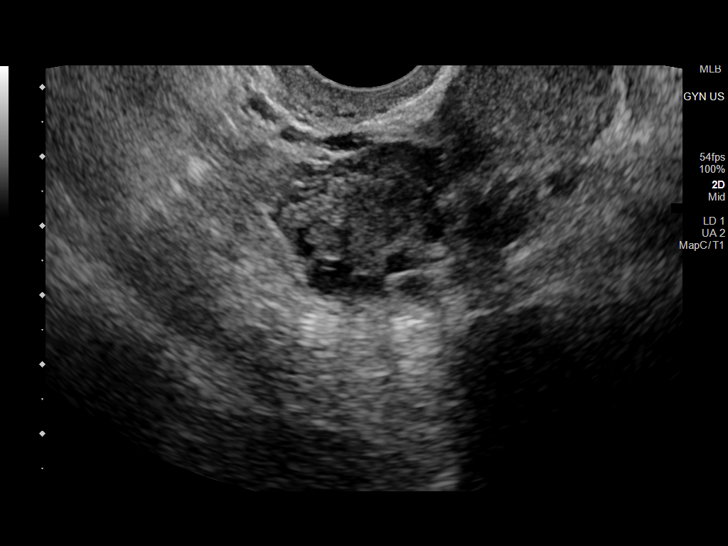
[im 54/59]
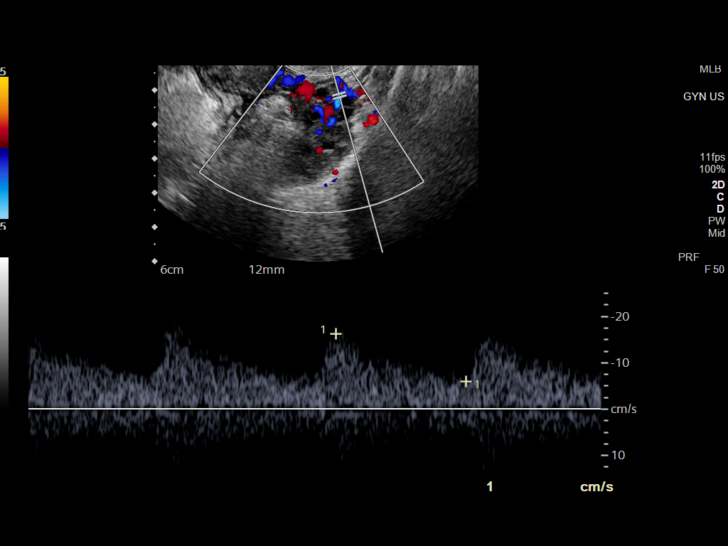
[im 59/59]
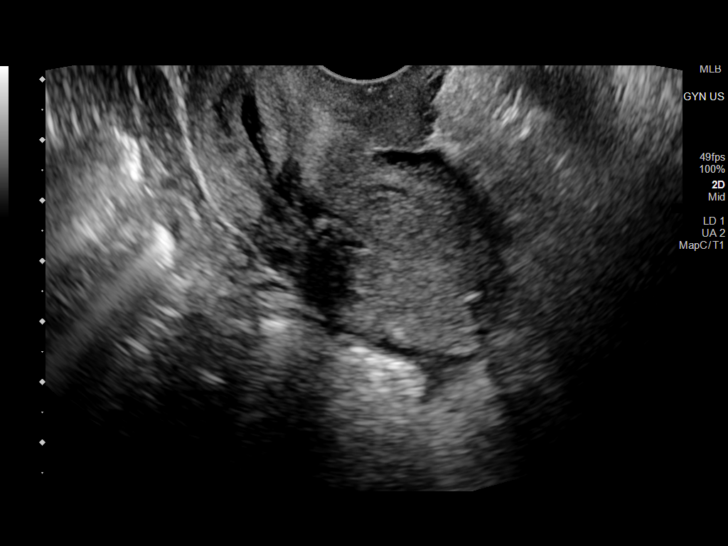

[14 of 25 positions shown; findings below may reference images not displayed]

FINDINGS: Uterus

Measurements: 6.2 x 3.3 x 4.1 cm = volume: 43 mL. Retroflexed. No
fibroids or other mass visualized.

Endometrium

Thickness: 5.5 mm. IUD in appropriate position. Fluid is seen in the
lower endometrial cavity. No focal abnormality visualized.

Right ovary

Measurements: 3.0 x 1.7 x 2.6 cm = volume: 7.0 mL. Normal
appearance/no adnexal mass.

Left ovary

Measurements: 2.6 x 2.3 x 1.7 cm = volume: 5.3 mL. Normal
appearance/no adnexal mass.

Pulsed Doppler evaluation demonstrates normal low-resistance
arterial and venous waveforms in both ovaries.

Other: Trace free fluid seen in the pelvis.
IMPRESSION: 1. No evidence of ovarian torsion.
2.  IUD in appropriate position.

## 2022-08-05 ENCOUNTER — Other Ambulatory Visit (HOSPITAL_COMMUNITY): Payer: Self-pay

## 2022-08-05 MED ORDER — PANTOPRAZOLE SODIUM 40 MG PO TBEC
40.0000 mg | DELAYED_RELEASE_TABLET | Freq: Every day | ORAL | 1 refills | Status: DC
  Filled 2022-08-05: qty 90, 90d supply, fill #0
  Filled 2022-12-14: qty 90, 90d supply, fill #1

## 2022-08-09 ENCOUNTER — Other Ambulatory Visit (HOSPITAL_COMMUNITY): Payer: Self-pay

## 2022-08-09 MED ORDER — ONDANSETRON 4 MG PO TBDP
4.0000 mg | ORAL_TABLET | Freq: Four times a day (QID) | ORAL | 1 refills | Status: DC | PRN
  Filled 2022-08-09: qty 60, 15d supply, fill #0

## 2022-08-31 ENCOUNTER — Other Ambulatory Visit (HOSPITAL_COMMUNITY): Payer: Self-pay

## 2022-10-13 ENCOUNTER — Telehealth: Payer: Commercial Managed Care - PPO | Admitting: Nurse Practitioner

## 2022-10-13 DIAGNOSIS — J0140 Acute pansinusitis, unspecified: Secondary | ICD-10-CM

## 2022-10-13 DIAGNOSIS — R110 Nausea: Secondary | ICD-10-CM | POA: Diagnosis not present

## 2022-10-13 MED ORDER — ONDANSETRON HCL 4 MG PO TABS: 4.0000 mg | ORAL_TABLET | Freq: Three times a day (TID) | ORAL | 0 refills | Status: DC | PRN

## 2022-10-13 MED ORDER — AMOXICILLIN-POT CLAVULANATE 875-125 MG PO TABS: 1.0000 | ORAL_TABLET | Freq: Two times a day (BID) | ORAL | 0 refills | Status: AC

## 2022-10-13 NOTE — Progress Notes (Signed)
Virtual Visit Consent   Kayla Hampton, you are scheduled for a virtual visit with a [facility] provider today. Just as with appointments in the office, your consent must be obtained to participate. Your consent will be active for this visit and any virtual visit you may have with one of our providers in the next 365 days. If you have a MyChart account, a copy of this consent can be sent to you electronically.  As this is a virtual visit, video technology does not allow for your provider to perform a traditional examination. This may limit your provider's ability to fully assess your condition. If your provider identifies any concerns that need to be evaluated in person or the need to arrange testing (such as labs, EKG, etc.), we will make arrangements to do so. Although advances in technology are sophisticated, we cannot ensure that it will always work on either your end or our end. If the connection with a video visit is poor, the visit may have to be switched to a telephone visit. With either a video or telephone visit, we are not always able to ensure that we have a secure connection.  By engaging in this virtual visit, you consent to the provision of healthcare and authorize for your insurance to be billed (if applicable) for the services provided during this visit. Depending on your insurance coverage, you may receive a charge related to this service.  I need to obtain your verbal consent now. Are you willing to proceed with your visit today? Kayla Hampton has provided verbal consent on 10/13/2022 for a virtual visit (video or telephone). Apolonio Schneiders, FNP  Date: 10/13/2022 7:46 AM  Virtual Visit via Video Note   I, Apolonio Schneiders, connected with  Kayla Hampton  ([id number], [birth year]) on 10/13/22 at  7:45 AM EST by a video-enabled telemedicine application and verified that I am speaking with the correct person using two identifiers.  Location: Patient: Virtual Visit Location Patient:  Home Provider: Virtual Visit Location Provider: Home Office   I discussed the limitations of evaluation and management by telemedicine and the availability of in person appointments. The patient expressed understanding and agreed to proceed.    History of Present Illness: Kayla Hampton is a 32 y.o. who identifies as a female who was assigned female at birth, and is being seen today for complaints of sinus congestion for the past 2 weeks. Symptoms seemed to worsen in the past 24 hours with ear pain/pressure Darkened mucous from nasal drainage and productive cough She started to have vertigo yesterday as well   She has been using Mucinex and also continues to use Nasacort and allergy pill dailys   Denies any recent fever  Her most recent sinus infection was 6 months ago though she feels she does get them on a regular bases   Problems:  Patient Active Problem List   Diagnosis Date Noted   ADD (attention deficit disorder) 09/14/2021   GERD (gastroesophageal reflux disease) 09/25/2020   IUD (intrauterine device) in place 06/19/2013   Menorrhagia 05/28/2013   Anxiety 08/26/2011    Allergies: No Known Allergies Medications:  Current Outpatient Medications:    amoxicillin-clavulanate (AUGMENTIN) 875-125 MG tablet, Take 1 tablet by mouth 2 (two) times daily., Disp: 14 tablet, Rfl: 0   atomoxetine (STRATTERA) 25 MG capsule, Take 1 capsule (25 mg total) by mouth daily., Disp: 30 capsule, Rfl: 0   dicyclomine (BENTYL) 20 MG tablet, Take 1 tablet (20 mg total) by mouth 2 (two) times daily.,  Disp: 20 tablet, Rfl: 0   nortriptyline (PAMELOR) 10 MG capsule, Take 2 capsules (20 mg total) by mouth Nightly., Disp: 180 capsule, Rfl: 1   ondansetron (ZOFRAN-ODT) 4 MG disintegrating tablet, Take 1 tablet (4 mg total) by mouth every 8 (eight) hours as needed for nausea or vomiting., Disp: 30 tablet, Rfl: 1   ondansetron (ZOFRAN-ODT) 4 MG disintegrating tablet, Take 1 tablet (4 mg total) by mouth every 6  (six) hours as needed for nausea and vomiting, Disp: 60 tablet, Rfl: 1   pantoprazole (PROTONIX) 20 MG tablet, Take 1 tablet (20 mg total) by mouth 2 (two) times daily. (Patient not taking: Reported on 01/12/2022), Disp: 30 tablet, Rfl: 0   pantoprazole (PROTONIX) 40 MG tablet, Take 1 tablet (40 mg total) by mouth daily 30 minutes before meals, Disp: 90 tablet, Rfl: 1   potassium chloride (KLOR-CON) 10 MEQ tablet, Take 1 tablet (10 mEq total) by mouth daily for 5 days., Disp: 5 tablet, Rfl: 0   PRESCRIPTION MEDICATION, Mirena - IUD, Disp: , Rfl:    promethazine (PHENERGAN) 25 MG suppository, Place 1 suppository (25 mg total) rectally every 6 (six) hours as needed for nausea or vomiting., Disp: 12 each, Rfl: 0   vortioxetine HBr (TRINTELLIX) 20 MG TABS tablet, Take 1 tablet (20 mg total) by mouth daily., Disp: 90 tablet, Rfl: 1  Observations/Objective: Patient is well-developed, well-nourished in no acute distress.  Resting comfortably  at home.  Head is normocephalic, atraumatic.  No labored breathing.  Speech is clear and coherent with logical content.  Patient is alert and oriented at baseline.    Assessment and Plan: 1. Acute non-recurrent pansinusitis Continue allergy regimen  - amoxicillin-clavulanate (AUGMENTIN) 875-125 MG tablet; Take 1 tablet by mouth 2 (two) times daily for 7 days. Take with food  Dispense: 14 tablet; Refill: 0  2. Nausea  - ondansetron (ZOFRAN) 4 MG tablet; Take 1 tablet (4 mg total) by mouth every 8 (eight) hours as needed for nausea or vomiting.  Dispense: 20 tablet; Refill: 0     Follow Up Instructions: I discussed the assessment and treatment plan with the patient. The patient was provided an opportunity to ask questions and all were answered. The patient agreed with the plan and demonstrated an understanding of the instructions.  A copy of instructions were sent to the patient via MyChart unless otherwise noted below.    The patient was advised to call  back or seek an in-person evaluation if the symptoms worsen or if the condition fails to improve as anticipated.  Time:  I spent 8 minutes with the patient via telehealth technology discussing the above problems/concerns.    Apolonio Schneiders, FNP

## 2022-11-10 NOTE — Progress Notes (Unsigned)
   Acute Office Visit  Subjective:     Patient ID: Kayla Hampton, female    DOB: [birth year], 32 y.o.   MRN: [id number]  No chief complaint on file.   HPI Patient is in today for cough, congestion, and sinus pain/pressure.  ROS      Objective:    There were no vitals taken for this visit. {Vitals History (Optional):23777}  Physical Exam  No results found for any visits on 11/11/22.      Assessment & Plan:   Problem List Items Addressed This Visit   None   No orders of the defined types were placed in this encounter.   No follow-ups on file.  Owens Loffler, DO

## 2022-11-11 ENCOUNTER — Other Ambulatory Visit: Payer: Self-pay | Admitting: Family Medicine

## 2022-11-11 ENCOUNTER — Ambulatory Visit (INDEPENDENT_AMBULATORY_CARE_PROVIDER_SITE_OTHER): Payer: Commercial Managed Care - PPO | Admitting: Family Medicine

## 2022-11-11 VITALS — BP 105/71 | HR 77 | Resp 20 | Ht 62.0 in | Wt 125.3 lb

## 2022-11-11 DIAGNOSIS — K219 Gastro-esophageal reflux disease without esophagitis: Secondary | ICD-10-CM

## 2022-11-11 DIAGNOSIS — J019 Acute sinusitis, unspecified: Secondary | ICD-10-CM

## 2022-11-11 DIAGNOSIS — R0981 Nasal congestion: Secondary | ICD-10-CM | POA: Diagnosis not present

## 2022-11-11 DIAGNOSIS — R051 Acute cough: Secondary | ICD-10-CM

## 2022-11-11 DIAGNOSIS — J320 Chronic maxillary sinusitis: Secondary | ICD-10-CM

## 2022-11-11 DIAGNOSIS — R110 Nausea: Secondary | ICD-10-CM

## 2022-11-11 DIAGNOSIS — K3189 Other diseases of stomach and duodenum: Secondary | ICD-10-CM

## 2022-11-11 DIAGNOSIS — B9689 Other specified bacterial agents as the cause of diseases classified elsewhere: Secondary | ICD-10-CM | POA: Insufficient documentation

## 2022-11-11 MED ORDER — ONDANSETRON 4 MG PO TBDP: 4.0000 mg | ORAL_TABLET | Freq: Four times a day (QID) | ORAL | 1 refills | Status: DC | PRN

## 2022-11-11 MED ORDER — DICYCLOMINE HCL 20 MG PO TABS: 20.0000 mg | ORAL_TABLET | Freq: Two times a day (BID) | ORAL | 0 refills | Status: DC

## 2022-11-11 MED ORDER — ALBUTEROL SULFATE HFA 108 (90 BASE) MCG/ACT IN AERS: 2.0000 | INHALATION_SPRAY | Freq: Four times a day (QID) | RESPIRATORY_TRACT | 0 refills | Status: DC | PRN

## 2022-11-11 MED ORDER — DOXYCYCLINE HYCLATE 100 MG PO TABS: 100.0000 mg | ORAL_TABLET | Freq: Two times a day (BID) | ORAL | 0 refills | Status: AC

## 2022-11-11 NOTE — Assessment & Plan Note (Signed)
Have sent in refill of Bentyl per patient request

## 2022-11-11 NOTE — Assessment & Plan Note (Signed)
And symptoms and duration we will go ahead and treat for sinus infection.  She was previously on Augmentin that did not help clear her infection thus we will start with Doxy.  I have also sent a referral to ENT for chronic sinusitis since this is something that occurs multiple times throughout the year and I think she can benefit from talking to a specialist

## 2022-11-11 NOTE — Assessment & Plan Note (Signed)
-  Patient admits to wheezing and I will go ahead and give her albuterol for wheezing lung exam is clear today

## 2022-11-16 ENCOUNTER — Encounter: Payer: Self-pay | Admitting: Family Medicine

## 2022-11-16 DIAGNOSIS — R110 Nausea: Secondary | ICD-10-CM

## 2022-11-16 MED ORDER — ONDANSETRON 4 MG PO TBDP: 4.0000 mg | ORAL_TABLET | Freq: Four times a day (QID) | ORAL | 1 refills | Status: DC | PRN

## 2022-12-14 ENCOUNTER — Other Ambulatory Visit (HOSPITAL_COMMUNITY): Payer: Self-pay

## 2022-12-15 ENCOUNTER — Other Ambulatory Visit (HOSPITAL_COMMUNITY): Payer: Self-pay

## 2022-12-15 ENCOUNTER — Other Ambulatory Visit: Payer: Self-pay

## 2022-12-16 ENCOUNTER — Other Ambulatory Visit (HOSPITAL_COMMUNITY): Payer: Self-pay

## 2023-03-01 ENCOUNTER — Other Ambulatory Visit: Payer: Self-pay | Admitting: Medical-Surgical

## 2023-03-03 ENCOUNTER — Other Ambulatory Visit (HOSPITAL_COMMUNITY): Payer: Self-pay

## 2023-03-03 MED ORDER — VORTIOXETINE HBR 20 MG PO TABS
20.0000 mg | ORAL_TABLET | Freq: Every day | ORAL | 0 refills | Status: DC
  Filled 2023-03-03 – 2023-03-28 (×2): qty 30, 30d supply, fill #0

## 2023-03-08 ENCOUNTER — Other Ambulatory Visit (HOSPITAL_COMMUNITY): Payer: Self-pay

## 2023-03-09 ENCOUNTER — Telehealth: Payer: Self-pay | Admitting: Medical-Surgical

## 2023-03-09 ENCOUNTER — Other Ambulatory Visit (HOSPITAL_COMMUNITY): Payer: Self-pay

## 2023-03-09 NOTE — Telephone Encounter (Signed)
Pt would like to transfer care to Dr.Wachs. She stated that she was more comfortable with her. She has physical scheduled with Joy for 07/19, if okay to switch I can move her appt to Dr.Wachs on a different day.

## 2023-03-09 NOTE — Telephone Encounter (Signed)
That’s fine with me

## 2023-03-10 ENCOUNTER — Other Ambulatory Visit (HOSPITAL_COMMUNITY): Payer: Self-pay

## 2023-03-11 ENCOUNTER — Encounter: Payer: Commercial Managed Care - PPO | Admitting: Medical-Surgical

## 2023-03-14 ENCOUNTER — Ambulatory Visit (INDEPENDENT_AMBULATORY_CARE_PROVIDER_SITE_OTHER): Payer: BC Managed Care – PPO | Admitting: Family Medicine

## 2023-03-14 ENCOUNTER — Encounter: Payer: Self-pay | Admitting: Family Medicine

## 2023-03-14 VITALS — BP 101/61 | HR 78 | Resp 18 | Ht 62.0 in | Wt 119.2 lb

## 2023-03-14 DIAGNOSIS — R110 Nausea: Secondary | ICD-10-CM

## 2023-03-14 DIAGNOSIS — Z1159 Encounter for screening for other viral diseases: Secondary | ICD-10-CM

## 2023-03-14 DIAGNOSIS — Z114 Encounter for screening for human immunodeficiency virus [HIV]: Secondary | ICD-10-CM

## 2023-03-14 DIAGNOSIS — Z0000 Encounter for general adult medical examination without abnormal findings: Secondary | ICD-10-CM | POA: Diagnosis not present

## 2023-03-14 DIAGNOSIS — K3189 Other diseases of stomach and duodenum: Secondary | ICD-10-CM

## 2023-03-14 DIAGNOSIS — F411 Generalized anxiety disorder: Secondary | ICD-10-CM

## 2023-03-14 DIAGNOSIS — K219 Gastro-esophageal reflux disease without esophagitis: Secondary | ICD-10-CM

## 2023-03-14 MED ORDER — ONDANSETRON 4 MG PO TBDP: 4.0000 mg | ORAL_TABLET | Freq: Four times a day (QID) | ORAL | 1 refills | Status: DC | PRN

## 2023-03-14 MED ORDER — DICYCLOMINE HCL 20 MG PO TABS: 20.0000 mg | ORAL_TABLET | Freq: Two times a day (BID) | ORAL | 1 refills | Status: DC

## 2023-03-14 MED ORDER — BUSPIRONE HCL 15 MG PO TABS: 15.0000 mg | ORAL_TABLET | Freq: Two times a day (BID) | ORAL | 0 refills | Status: DC | PRN

## 2023-03-14 NOTE — Progress Notes (Signed)
Established patient visit   Patient: Kayla Hampton   DOB: 02-12-1991   31 y.o. Female  MRN: [id number] Visit Date: 03/14/2023  Today's healthcare provider: Charlton Amor, DO   Chief Complaint  Patient presents with   Annual Exam    Pt comes in today for physical    SUBJECTIVE    Chief Complaint  Patient presents with   Annual Exam    Pt comes in today for physical   HPI Doing well with diet and exercise. Is stressed from work. Was unable to get her trintellix due to insurance deductible for pharmaceuticals. Does notice she is more stressed lately. Denies homicidal or suicidal ideation.  Needs refills on meds  Diet: balanced, not eating much due to work stress Exercises: regularly   Needs pap with gyn    Review of Systems  Constitutional:  Negative for activity change, fatigue and fever.  Respiratory:  Negative for cough and shortness of breath.   Cardiovascular:  Negative for chest pain.  Gastrointestinal:  Negative for abdominal pain.  Genitourinary:  Negative for difficulty urinating.       Current Meds  Medication Sig   albuterol (VENTOLIN HFA) 108 (90 Base) MCG/ACT inhaler Inhale 2 puffs into the lungs every 6 (six) hours as needed for wheezing or shortness of breath.   busPIRone (BUSPAR) 15 MG tablet Take 1 tablet (15 mg total) by mouth 2 (two) times daily as needed.   levonorgestrel (MIRENA) 20 MCG/DAY IUD 1 each by Intrauterine route once.   nortriptyline (PAMELOR) 10 MG capsule Take 2 capsules (20 mg total) by mouth Nightly.   pantoprazole (PROTONIX) 40 MG tablet Take 1 tablet (40 mg total) by mouth daily 30 minutes before meals   promethazine (PHENERGAN) 25 MG suppository Place 1 suppository (25 mg total) rectally every 6 (six) hours as needed for nausea or vomiting.   [DISCONTINUED] dicyclomine (BENTYL) 20 MG tablet Take 1 tablet (20 mg total) by mouth 2 (two) times daily.   [DISCONTINUED] ondansetron (ZOFRAN-ODT) 4 MG disintegrating tablet Take 1  tablet (4 mg total) by mouth every 6 (six) hours as needed for nausea and vomiting    OBJECTIVE    BP 101/61 (BP Location: Right Arm, Patient Position: Sitting, Cuff Size: Normal)   Pulse 78   Resp 18   Ht 5\' 2"  (1.575 m)   Wt 119 lb 4 oz (54.1 kg)   SpO2 100%   BMI 21.81 kg/m   Physical Exam Vitals and nursing note reviewed.  Constitutional:      General: She is not in acute distress.    Appearance: Normal appearance.  HENT:     Head: Normocephalic and atraumatic.     Right Ear: External ear normal.     Left Ear: External ear normal.     Nose: Nose normal.  Eyes:     Conjunctiva/sclera: Conjunctivae normal.  Cardiovascular:     Rate and Rhythm: Normal rate and regular rhythm.  Pulmonary:     Effort: Pulmonary effort is normal.     Breath sounds: Normal breath sounds.  Abdominal:     General: Abdomen is flat. Bowel sounds are normal.     Palpations: Abdomen is soft.  Neurological:     General: No focal deficit present.     Mental Status: She is alert and oriented to person, place, and time.  Psychiatric:        Mood and Affect: Mood normal.        Behavior:  Behavior normal.        Thought Content: Thought content normal.        Judgment: Judgment normal.          ASSESSMENT & PLAN    Problem List Items Addressed This Visit       Digestive   GERD (gastroesophageal reflux disease)   Relevant Medications   ondansetron (ZOFRAN-ODT) 4 MG disintegrating tablet   dicyclomine (BENTYL) 20 MG tablet     Other   GAD (generalized anxiety disorder)    - pt experiencing anxiety at work. Has tried celexa, zoloft which didn't work for her due to side effects. She was on trintellix which helped but insurance stopped covering it so she has been off this due to a high drug deductible. She does still feel anxious - discussed starting buspar 15mg  BID prn for anxiety and explained how she can break off and do 5mg  piece to see if this helps her       Relevant Medications    busPIRone (BUSPAR) 15 MG tablet   Other Visit Diagnoses     Routine adult health maintenance    -  Primary   Relevant Orders   Lipid panel   Basic Metabolic Panel (BMET)   Ambulatory referral to Obstetrics / Gynecology   Nausea       Relevant Medications   ondansetron (ZOFRAN-ODT) 4 MG disintegrating tablet   Stomach spasm       Relevant Medications   dicyclomine (BENTYL) 20 MG tablet   Screening for HIV (human immunodeficiency virus)       Relevant Orders   HIV antibody (with reflex)   Encounter for hepatitis C screening test for low risk patient       Relevant Orders   Hepatitis C antibody       No follow-ups on file.      Meds ordered this encounter  Medications   ondansetron (ZOFRAN-ODT) 4 MG disintegrating tablet    Sig: Take 1 tablet (4 mg total) by mouth every 6 (six) hours as needed for nausea.    Dispense:  30 tablet    Refill:  1   dicyclomine (BENTYL) 20 MG tablet    Sig: Take 1 tablet (20 mg total) by mouth 2 (two) times daily.    Dispense:  30 tablet    Refill:  1   busPIRone (BUSPAR) 15 MG tablet    Sig: Take 1 tablet (15 mg total) by mouth 2 (two) times daily as needed.    Dispense:  60 tablet    Refill:  0    Orders Placed This Encounter  Procedures   Lipid panel    Order Specific Question:   Has the patient fasted?    Answer:   No    Order Specific Question:   Release to patient    Answer:   Immediate   Basic Metabolic Panel (BMET)    Order Specific Question:   Has the patient fasted?    Answer:   No   HIV antibody (with reflex)   Hepatitis C antibody   Ambulatory referral to Obstetrics / Gynecology    Referral Priority:   Routine    Referral Type:   Consultation    Referral Reason:   Specialty Services Required    Requested Specialty:   Obstetrics and Gynecology     Charlton Amor, DO  [facility] at [facility] [phone] (phone) [phone] (fax)  [facility]

## 2023-03-14 NOTE — Assessment & Plan Note (Signed)
-   pt experiencing anxiety at work. Has tried celexa, zoloft which didn't work for her due to side effects. She was on trintellix which helped but insurance stopped covering it so she has been off this due to a high drug deductible. She does still feel anxious - discussed starting buspar 15mg  BID prn for anxiety and explained how she can break off and do 5mg  piece to see if this helps her

## 2023-03-22 ENCOUNTER — Other Ambulatory Visit: Payer: Self-pay | Admitting: Family Medicine

## 2023-03-22 DIAGNOSIS — K3189 Other diseases of stomach and duodenum: Secondary | ICD-10-CM

## 2023-03-22 DIAGNOSIS — K219 Gastro-esophageal reflux disease without esophagitis: Secondary | ICD-10-CM

## 2023-03-28 ENCOUNTER — Other Ambulatory Visit (HOSPITAL_COMMUNITY): Payer: Self-pay

## 2023-04-06 ENCOUNTER — Other Ambulatory Visit: Payer: Self-pay | Admitting: Family Medicine

## 2023-04-12 ENCOUNTER — Encounter: Payer: Self-pay | Admitting: Family Medicine

## 2023-04-13 ENCOUNTER — Encounter: Payer: Self-pay | Admitting: Family Medicine

## 2023-04-14 ENCOUNTER — Ambulatory Visit (INDEPENDENT_AMBULATORY_CARE_PROVIDER_SITE_OTHER): Payer: BC Managed Care – PPO | Admitting: Family Medicine

## 2023-04-14 ENCOUNTER — Encounter: Payer: Self-pay | Admitting: Family Medicine

## 2023-04-14 ENCOUNTER — Other Ambulatory Visit (HOSPITAL_COMMUNITY)
Admission: RE | Admit: 2023-04-14 | Discharge: 2023-04-14 | Disposition: A | Discharge status: Home / Self Care | Payer: BC Managed Care – PPO | Source: Ambulatory Visit | Attending: Family Medicine | Admitting: Family Medicine

## 2023-04-14 VITALS — BP 139/83 | HR 86 | Resp 16 | Ht 62.0 in | Wt 110.0 lb

## 2023-04-14 DIAGNOSIS — Z0000 Encounter for general adult medical examination without abnormal findings: Secondary | ICD-10-CM | POA: Diagnosis not present

## 2023-04-14 DIAGNOSIS — Z114 Encounter for screening for human immunodeficiency virus [HIV]: Secondary | ICD-10-CM

## 2023-04-14 DIAGNOSIS — Z124 Encounter for screening for malignant neoplasm of cervix: Secondary | ICD-10-CM

## 2023-04-14 DIAGNOSIS — Z1159 Encounter for screening for other viral diseases: Secondary | ICD-10-CM

## 2023-04-14 DIAGNOSIS — Z113 Encounter for screening for infections with a predominantly sexual mode of transmission: Secondary | ICD-10-CM | POA: Diagnosis not present

## 2023-04-14 NOTE — Addendum Note (Signed)
Addended by: Charlton Amor on: 04/14/2023 11:52 AM   Modules accepted: Level of Service

## 2023-04-14 NOTE — Progress Notes (Signed)
Established patient visit   Patient: Kayla Hampton   DOB: [birth year]   31 y.o. Female  MRN: [id number] Visit Date: 04/14/2023  Today's healthcare provider: Charlton Amor, DO   Chief Complaint  Patient presents with   Gynecologic Exam    SUBJECTIVE    Chief Complaint  Patient presents with   Gynecologic Exam   HPI  Pt here for pap smear and to get blood work for wellness exam.  Review of Systems  Constitutional:  Negative for activity change, fatigue and fever.  Respiratory:  Negative for cough and shortness of breath.   Cardiovascular:  Negative for chest pain.  Gastrointestinal:  Negative for abdominal pain.  Genitourinary:  Negative for difficulty urinating.       No outpatient medications have been marked as taking for the 04/14/23 encounter (Office Visit) with Charlton Amor, DO.    OBJECTIVE    BP 139/83 (BP Location: Right Arm, Patient Position: Sitting, Cuff Size: Small)   Pulse 86   Resp 16   Ht 5\' 2"  (1.575 m)   Wt 110 lb (49.9 kg)   SpO2 100%   BMI 20.12 kg/m   Physical Exam Vitals and nursing note reviewed.  Constitutional:      General: She is not in acute distress.    Appearance: Normal appearance.  HENT:     Head: Normocephalic and atraumatic.     Right Ear: External ear normal.     Left Ear: External ear normal.     Nose: Nose normal.  Eyes:     Conjunctiva/sclera: Conjunctivae normal.  Cardiovascular:     Rate and Rhythm: Normal rate.  Pulmonary:     Effort: Pulmonary effort is normal.  Genitourinary:    Comments: Speculum exam done, no adnexal masses or polyps noted Neurological:     General: No focal deficit present.     Mental Status: She is alert and oriented to person, place, and time.  Psychiatric:        Mood and Affect: Mood normal.        Behavior: Behavior normal.        Thought Content: Thought content normal.        Judgment: Judgment normal.        ASSESSMENT & PLAN    Problem List Items Addressed This  Visit   None Visit Diagnoses     Routine adult health maintenance    -  Primary   Relevant Orders   Lipid panel   Basic Metabolic Panel (BMET)   CBC   Screening for cervical cancer       Relevant Orders   Cytology - PAP   Screening for STD (sexually transmitted disease)       Relevant Orders   WET PREP FOR TRICH, YEAST, CLUE   RPR   Screening for HIV (human immunodeficiency virus)       Relevant Orders   HIV antibody (with reflex)   Encounter for hepatitis C screening test for low risk patient       Relevant Orders   Hepatitis C antibody       Return if symptoms worsen or fail to improve.      No orders of the defined types were placed in this encounter.   Orders Placed This Encounter  Procedures   WET PREP FOR TRICH, YEAST, CLUE   Lipid panel    Order Specific Question:   Has the patient fasted?    Answer:  No    Order Specific Question:   Release to patient    Answer:   Immediate   Hepatitis C antibody   Basic Metabolic Panel (BMET)    Order Specific Question:   Has the patient fasted?    Answer:   No   CBC   HIV antibody (with reflex)   RPR     Charlton Amor, DO  [facility] Medicine at [facility] [phone] (phone) [phone] (fax)  [facility]

## 2023-04-15 ENCOUNTER — Encounter: Payer: Self-pay | Admitting: Family Medicine

## 2023-04-15 LAB — HIV ANTIBODY (ROUTINE TESTING W REFLEX): HIV Screen 4th Generation wRfx: NONREACTIVE

## 2023-04-15 LAB — BASIC METABOLIC PANEL
BUN/Creatinine Ratio: 12 (ref 9–23)
BUN: 9 mg/dL (ref 6–20)
CO2: 22 mmol/L (ref 20–29)
Calcium: 9.9 mg/dL (ref 8.7–10.2)
Chloride: 107 mmol/L — ABNORMAL HIGH (ref 96–106)
Creatinine, Ser: 0.74 mg/dL (ref 0.57–1.00)
Glucose: 86 mg/dL (ref 70–99)
Potassium: 4.1 mmol/L (ref 3.5–5.2)
Sodium: 142 mmol/L (ref 134–144)
eGFR: 110 mL/min/{1.73_m2} (ref >59)

## 2023-04-15 LAB — CBC
Hematocrit: 37.3 % (ref 34.0–46.6)
Hemoglobin: 13.1 g/dL (ref 11.1–15.9)
MCH: 31.6 pg (ref 26.6–33.0)
MCHC: 35.1 g/dL (ref 31.5–35.7)
MCV: 90 fL (ref 79–97)
Platelets: 368 10*3/uL (ref 150–450)
RBC: 4.15 x10E6/uL (ref 3.77–5.28)
RDW: 11.8 % (ref 11.7–15.4)
WBC: 4.3 10*3/uL (ref 3.4–10.8)

## 2023-04-15 LAB — LIPID PANEL
Chol/HDL Ratio: 3 ratio (ref 0.0–4.4)
Cholesterol, Total: 169 mg/dL (ref 100–199)
HDL: 56 mg/dL (ref >39)
LDL Chol Calc (NIH): 100 mg/dL — ABNORMAL HIGH (ref 0–99)
Triglycerides: 66 mg/dL (ref 0–149)
VLDL Cholesterol Cal: 13 mg/dL (ref 5–40)

## 2023-04-15 LAB — WET PREP FOR TRICH, YEAST, CLUE
Clue Cell Exam: NEGATIVE
Trichomonas Exam: NEGATIVE
Yeast Exam: NEGATIVE

## 2023-04-15 LAB — HEPATITIS C ANTIBODY: Hep C Virus Ab: NONREACTIVE

## 2023-04-15 LAB — RPR: RPR Ser Ql: NONREACTIVE

## 2023-04-18 ENCOUNTER — Other Ambulatory Visit: Payer: Self-pay | Admitting: Family Medicine

## 2023-04-18 MED ORDER — HYDROXYZINE HCL 10 MG PO TABS: 10.0000 mg | ORAL_TABLET | Freq: Three times a day (TID) | ORAL | 0 refills | Status: DC | PRN

## 2023-04-20 ENCOUNTER — Other Ambulatory Visit: Payer: Self-pay | Admitting: Family Medicine

## 2023-04-20 DIAGNOSIS — R87612 Low grade squamous intraepithelial lesion on cytologic smear of cervix (LGSIL): Secondary | ICD-10-CM

## 2023-04-20 LAB — CYTOLOGY - PAP
Comment: NEGATIVE
Comment: NEGATIVE
Comment: NEGATIVE
HPV 16: NEGATIVE
HPV 18 / 45: NEGATIVE
High risk HPV: POSITIVE — AB

## 2023-05-16 ENCOUNTER — Other Ambulatory Visit (HOSPITAL_COMMUNITY)
Admission: RE | Admit: 2023-05-16 | Discharge: 2023-05-16 | Disposition: A | Discharge status: Home / Self Care | Payer: No Typology Code available for payment source | Source: Ambulatory Visit | Attending: Obstetrics & Gynecology | Admitting: Obstetrics & Gynecology

## 2023-05-16 ENCOUNTER — Ambulatory Visit (INDEPENDENT_AMBULATORY_CARE_PROVIDER_SITE_OTHER): Payer: 59 | Admitting: Obstetrics & Gynecology

## 2023-05-16 ENCOUNTER — Encounter: Payer: Self-pay | Admitting: Obstetrics & Gynecology

## 2023-05-16 VITALS — BP 125/75 | HR 73 | Resp 16 | Ht 62.0 in

## 2023-05-16 DIAGNOSIS — Z3202 Encounter for pregnancy test, result negative: Secondary | ICD-10-CM | POA: Diagnosis not present

## 2023-05-16 DIAGNOSIS — R87612 Low grade squamous intraepithelial lesion on cytologic smear of cervix (LGSIL): Secondary | ICD-10-CM

## 2023-05-16 DIAGNOSIS — R87810 Cervical high risk human papillomavirus (HPV) DNA test positive: Secondary | ICD-10-CM | POA: Diagnosis not present

## 2023-05-16 DIAGNOSIS — R877 Abnormal histological findings in specimens from female genital organs: Secondary | ICD-10-CM

## 2023-05-16 DIAGNOSIS — Z975 Presence of (intrauterine) contraceptive device: Secondary | ICD-10-CM

## 2023-05-16 LAB — POCT URINE PREGNANCY: Preg Test, Ur: NEGATIVE

## 2023-05-18 LAB — SURGICAL PATHOLOGY

## 2023-05-23 DIAGNOSIS — R877 Abnormal histological findings in specimens from female genital organs: Secondary | ICD-10-CM | POA: Insufficient documentation

## 2023-05-23 NOTE — Progress Notes (Signed)
   Subjective:    Patient ID: Kayla Hampton, female    DOB: 03/24/1991, 32 y.o.   MRN: [id number]  HPI  Pt presents on referral from PCP for low grade pap smear.  No history of prior abnormal paps nor procedures on cervix.  Mother is present for support.    Review of Systems  Constitutional: Negative.   Respiratory: Negative.    Cardiovascular: Negative.   Gastrointestinal: Negative.   Genitourinary: Negative.        Objective:   Physical Exam Vitals reviewed.  Constitutional:      General: She is not in acute distress.    Appearance: She is well-developed.  HENT:     Head: Normocephalic and atraumatic.  Eyes:     Conjunctiva/sclera: Conjunctivae normal.  Cardiovascular:     Rate and Rhythm: Normal rate.  Pulmonary:     Effort: Pulmonary effort is normal.  Skin:    General: Skin is warm and dry.  Neurological:     Mental Status: She is alert and oriented to person, place, and time.  Psychiatric:        Mood and Affect: Mood normal.    Vitals:   05/16/23 1041  BP: 125/75  Pulse: 73  Resp: 16  Height: 5\' 2"  (1.575 m)      Assessment & Plan:  32 yo female with LSIL pap completed at PCP   No contraindications to colposcopy today Colposcopy performed.  Continue IUD contraception

## 2023-05-23 NOTE — Progress Notes (Signed)
Colposcopy Procedure Note  Indications: Pap smear 1 months ago showed: low-grade squamous intraepithelial neoplasia (LGSIL - encompassing HPV,mild dysplasia,CIN I). The prior pap showed no abnormalities.per patient--do not have records  Prior cervical/vaginal disease: normal exam without visible pathology. Prior cervical treatment: no treatment.  Procedure Details  The risks and benefits of the procedure and Written informed consent obtained.  Speculum placed in vagina and excellent visualization of cervix achieved, cervix swabbed x 3 with acetic acid solution.  Findings: Cervix: acetowhite lesion(s) noted at 11 o'clock; cervix swabbed with Lugol's solution, SCJ visualized - lesion at 11 o'clock, cervical biopsies taken at 11 o'clock, ECC performed; specimen labelled and sent to pathology, and hemostasis achieved with Monsel's solution. Vaginal inspection: vaginal colposcopy not performed. Vulvar colposcopy: vulvar colposcopy not performed.  Specimens: cervical biopsy at 11 o'clock and EDD  Complications: none.  Plan: Specimens labelled and sent to Pathology.

## 2023-06-14 ENCOUNTER — Encounter: Payer: Self-pay | Admitting: Family Medicine

## 2023-06-14 DIAGNOSIS — R110 Nausea: Secondary | ICD-10-CM

## 2023-06-14 MED ORDER — ONDANSETRON 4 MG PO TBDP: 4.0000 mg | ORAL_TABLET | Freq: Four times a day (QID) | ORAL | 1 refills | Status: DC | PRN

## 2023-08-04 ENCOUNTER — Telehealth (INDEPENDENT_AMBULATORY_CARE_PROVIDER_SITE_OTHER): Payer: 59 | Admitting: Family Medicine

## 2023-08-04 ENCOUNTER — Encounter: Payer: Self-pay | Admitting: Family Medicine

## 2023-08-04 DIAGNOSIS — B9689 Other specified bacterial agents as the cause of diseases classified elsewhere: Secondary | ICD-10-CM | POA: Diagnosis not present

## 2023-08-04 DIAGNOSIS — J019 Acute sinusitis, unspecified: Secondary | ICD-10-CM | POA: Diagnosis not present

## 2023-08-04 DIAGNOSIS — Z87891 Personal history of nicotine dependence: Secondary | ICD-10-CM | POA: Diagnosis not present

## 2023-08-04 MED ORDER — DOXYCYCLINE HYCLATE 100 MG PO TABS
100.0000 mg | ORAL_TABLET | Freq: Two times a day (BID) | ORAL | 0 refills | Status: AC
Start: 2023-08-04 — End: 2023-08-11

## 2023-08-04 MED ORDER — FLUCONAZOLE 150 MG PO TABS: 150.0000 mg | ORAL_TABLET | Freq: Once | ORAL | 0 refills | Status: AC

## 2023-08-04 MED ORDER — METHYLPREDNISOLONE 4 MG PO TBPK
ORAL_TABLET | ORAL | 0 refills | Status: DC
Start: 2023-08-04 — End: 2024-01-05

## 2023-08-04 NOTE — Assessment & Plan Note (Signed)
Based on history and pmh of chronic sinusitis I do believe pt is having a acute bacterial sinus infection. Will go ahead and do doxycycline. Pt also sounds like she is very congested so doxycycline will help with antiinflammatory properties. 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

## 2023-08-04 NOTE — Progress Notes (Addendum)
Established patient visit   Patient: Kayla Hampton   DOB: 04-24-1991   32 y.o. Female  MRN: [id number] Visit Date: 08/04/2023  Today's healthcare provider: Charlton Amor, DO   Chief Complaint  Patient presents with   Sinus Problem    SUBJECTIVE    Chief Complaint  Patient presents with   Sinus Problem   HPI   I connected with  Arrie Senate on 08/04/23 by a video and audio enabled telemedicine application and verified that I am speaking with the correct person using two identifiers.  Patient Location: Home  Provider Location: Office/Clinic  I discussed the limitations of evaluation and management by telemedicine. The patient expressed understanding and agreed to proceed.  Pt says she has sinus pain and congestion that has been ongoing for over a week and a half. Has tried OTC treatments that have not helped.    Review of Systems  Constitutional:  Negative for activity change, fatigue and fever.  HENT:  Positive for congestion and sinus pressure.   Respiratory:  Negative for cough and shortness of breath.   Cardiovascular:  Negative for chest pain.  Gastrointestinal:  Negative for abdominal pain.  Genitourinary:  Negative for difficulty urinating.       Current Meds  Medication Sig   busPIRone (BUSPAR) 15 MG tablet TAKE 1 TABLET BY MOUTH TWICE A DAY AS NEEDED   doxycycline (VIBRA-TABS) 100 MG tablet Take 1 tablet (100 mg total) by mouth 2 (two) times daily for 7 days.   fluconazole (DIFLUCAN) 150 MG tablet Take 1 tablet (150 mg total) by mouth once for 1 dose. If no better in 72 hours take second pill   hydrOXYzine (ATARAX) 10 MG tablet Take 1 tablet (10 mg total) by mouth 3 (three) times daily as needed for anxiety.   levonorgestrel (MIRENA) 20 MCG/DAY IUD 1 each by Intrauterine route once.   methylPREDNISolone (MEDROL DOSEPAK) 4 MG TBPK tablet Follow instructions on pill pack   ondansetron (ZOFRAN-ODT) 4 MG disintegrating tablet Take 1 tablet (4 mg total)  by mouth every 6 (six) hours as needed for nausea.    OBJECTIVE      Physical Exam Vitals reviewed.  Constitutional:      Appearance: She is well-developed.  HENT:     Head: Normocephalic and atraumatic.  Eyes:     Conjunctiva/sclera: Conjunctivae normal.  Cardiovascular:     Rate and Rhythm: Normal rate.  Pulmonary:     Effort: Pulmonary effort is normal.  Skin:    General: Skin is dry.     Coloration: Skin is not pale.  Neurological:     Mental Status: She is alert and oriented to person, place, and time.  Psychiatric:        Behavior: Behavior normal.        ASSESSMENT & PLAN    Problem List Items Addressed This Visit       Respiratory   Acute bacterial sinusitis - Primary   Based on history and pmh of chronic sinusitis I do believe pt is having a acute bacterial sinus infection. Will go ahead and do doxycycline. Pt also sounds like she is very congested so doxycycline will help with antiinflammatory properties. 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      Meds ordered this encounter  Medications   doxycycline (VIBRA-TABS) 100 MG tablet    Sig: Take 1 tablet (100 mg total) by mouth 2 (two) times daily for 7 days.    Dispense:  14 tablet    Refill:  0   methylPREDNISolone (MEDROL DOSEPAK) 4 MG TBPK tablet    Sig: Follow instructions on pill pack    Dispense:  21 tablet    Refill:  0   fluconazole (DIFLUCAN) 150 MG tablet    Sig: Take 1 tablet (150 mg total) by mouth once for 1 dose. If no better in 72 hours take second pill    Dispense:  2 tablet    Refill:  0    No orders of the defined types were placed in this encounter.    Charlton Amor, DO  [facility]  Health Primary Care & Sports Medicine at [facility] [phone] (phone) [phone] (fax)  [facility]

## 2023-08-04 NOTE — Addendum Note (Signed)
Addended by: Charlton Amor on: 08/04/2023 01:51 PM   Modules accepted: Orders

## 2023-08-22 ENCOUNTER — Encounter: Payer: Self-pay | Admitting: Family Medicine

## 2023-12-29 ENCOUNTER — Encounter: Payer: Self-pay | Admitting: Family Medicine

## 2024-01-05 ENCOUNTER — Telehealth: Admitting: Physician Assistant

## 2024-01-05 DIAGNOSIS — J019 Acute sinusitis, unspecified: Secondary | ICD-10-CM | POA: Diagnosis not present

## 2024-01-05 DIAGNOSIS — B9689 Other specified bacterial agents as the cause of diseases classified elsewhere: Secondary | ICD-10-CM | POA: Diagnosis not present

## 2024-01-05 MED ORDER — DOXYCYCLINE HYCLATE 100 MG PO TABS: 100.0000 mg | ORAL_TABLET | Freq: Two times a day (BID) | ORAL | 0 refills | Status: AC

## 2024-01-05 NOTE — Progress Notes (Signed)

## 2024-01-05 NOTE — Progress Notes (Signed)
 I have spent 5 minutes in review of e-visit questionnaire, review and updating patient chart, medical decision making and response to patient.   Piedad Climes, PA-C

## 2024-01-23 ENCOUNTER — Encounter: Payer: Self-pay | Admitting: Family Medicine

## 2024-01-23 DIAGNOSIS — R110 Nausea: Secondary | ICD-10-CM

## 2024-01-24 MED ORDER — ONDANSETRON 4 MG PO TBDP: 4.0000 mg | ORAL_TABLET | Freq: Four times a day (QID) | ORAL | 1 refills | Status: AC | PRN

## 2024-04-24 ENCOUNTER — Encounter: Payer: Self-pay | Admitting: Sports Medicine
